# Patient Record
Sex: Male | Born: 1960
Health system: Southern US, Community
[De-identification: ages and names within clinical notes are randomized; demographics above are authoritative.]

## PROBLEM LIST (undated history)

## (undated) DIAGNOSIS — E119 Type 2 diabetes mellitus without complications: Secondary | ICD-10-CM

## (undated) DIAGNOSIS — K5090 Crohn's disease, unspecified, without complications: Secondary | ICD-10-CM

## (undated) DIAGNOSIS — N12 Tubulo-interstitial nephritis, not specified as acute or chronic: Secondary | ICD-10-CM

## (undated) DIAGNOSIS — I2699 Other pulmonary embolism without acute cor pulmonale: Secondary | ICD-10-CM

## (undated) HISTORY — DX: Type 2 diabetes mellitus without complications: E11.9

## (undated) HISTORY — PX: URETERAL STENT PLACEMENT: SHX822

---

## 2008-04-11 ENCOUNTER — Ambulatory Visit: Payer: Self-pay | Admitting: Internal Medicine

## 2008-08-05 ENCOUNTER — Ambulatory Visit: Payer: Self-pay | Admitting: Gastroenterology

## 2008-08-13 ENCOUNTER — Ambulatory Visit: Payer: Self-pay | Admitting: Gastroenterology

## 2008-08-19 ENCOUNTER — Ambulatory Visit: Payer: Self-pay | Admitting: Gastroenterology

## 2008-09-18 ENCOUNTER — Ambulatory Visit: Payer: Self-pay | Admitting: Gastroenterology

## 2008-12-18 ENCOUNTER — Ambulatory Visit: Payer: Self-pay | Admitting: Internal Medicine

## 2010-11-20 ENCOUNTER — Ambulatory Visit: Payer: Self-pay | Admitting: Internal Medicine

## 2011-01-22 ENCOUNTER — Ambulatory Visit: Payer: Self-pay | Admitting: Internal Medicine

## 2013-12-25 ENCOUNTER — Ambulatory Visit: Payer: Self-pay | Admitting: Physician Assistant

## 2014-01-01 ENCOUNTER — Ambulatory Visit: Payer: Self-pay | Admitting: Physician Assistant

## 2014-01-29 ENCOUNTER — Emergency Department: Payer: Self-pay | Admitting: Emergency Medicine

## 2014-01-29 LAB — CBC WITH DIFFERENTIAL/PLATELET
Basophil #: 0.1 10*3/uL (ref 0.0–0.1)
Basophil %: 1.3 %
Eosinophil #: 0.2 10*3/uL (ref 0.0–0.7)
Eosinophil %: 2.6 %
HCT: 36 % — ABNORMAL LOW (ref 40.0–52.0)
HGB: 11 g/dL — ABNORMAL LOW (ref 13.0–18.0)
Lymphocyte #: 1.4 10*3/uL (ref 1.0–3.6)
Lymphocyte %: 24.1 %
MCH: 21.6 pg — ABNORMAL LOW (ref 26.0–34.0)
MCHC: 30.6 g/dL — ABNORMAL LOW (ref 32.0–36.0)
MCV: 71 fL — ABNORMAL LOW (ref 80–100)
Monocyte #: 0.6 x10 3/mm (ref 0.2–1.0)
Monocyte %: 9.9 %
Neutrophil #: 3.7 10*3/uL (ref 1.4–6.5)
Neutrophil %: 62.1 %
Platelet: 240 10*3/uL (ref 150–440)
RBC: 5.09 10*6/uL (ref 4.40–5.90)
RDW: 18 % — ABNORMAL HIGH (ref 11.5–14.5)
WBC: 6 10*3/uL (ref 3.8–10.6)

## 2014-01-29 LAB — COMPREHENSIVE METABOLIC PANEL
Albumin: 3 g/dL — ABNORMAL LOW (ref 3.4–5.0)
Alkaline Phosphatase: 82 U/L
Anion Gap: 6 — ABNORMAL LOW (ref 7–16)
BUN: 13 mg/dL (ref 7–18)
Bilirubin,Total: 0.3 mg/dL (ref 0.2–1.0)
Calcium, Total: 8.4 mg/dL — ABNORMAL LOW (ref 8.5–10.1)
Chloride: 107 mmol/L (ref 98–107)
Co2: 25 mmol/L (ref 21–32)
Creatinine: 0.99 mg/dL (ref 0.60–1.30)
EGFR (African American): >60
EGFR (Non-African Amer.): >60
Glucose: 102 mg/dL — ABNORMAL HIGH (ref 65–99)
Osmolality: 276 (ref 275–301)
Potassium: 3.6 mmol/L (ref 3.5–5.1)
SGOT(AST): 17 U/L (ref 15–37)
SGPT (ALT): 24 U/L
Sodium: 138 mmol/L (ref 136–145)
Total Protein: 8.1 g/dL (ref 6.4–8.2)

## 2014-01-29 LAB — APTT: Activated PTT: 27.5 secs (ref 23.6–35.9)

## 2014-01-29 LAB — PROTIME-INR
INR: 1
Prothrombin Time: 12.7 secs (ref 11.5–14.7)

## 2014-01-29 LAB — TROPONIN I: Troponin-I: <0.02 ng/mL

## 2014-03-22 ENCOUNTER — Ambulatory Visit: Payer: Self-pay

## 2014-03-22 LAB — COMPREHENSIVE METABOLIC PANEL
Albumin: 2.4 g/dL — ABNORMAL LOW (ref 3.4–5.0)
Alkaline Phosphatase: 205 U/L — ABNORMAL HIGH
Anion Gap: 9 (ref 7–16)
BUN: 9 mg/dL (ref 7–18)
Bilirubin,Total: 0.6 mg/dL (ref 0.2–1.0)
Calcium, Total: 8.8 mg/dL (ref 8.5–10.1)
Chloride: 95 mmol/L — ABNORMAL LOW (ref 98–107)
Co2: 28 mmol/L (ref 21–32)
Creatinine: 1.28 mg/dL (ref 0.60–1.30)
EGFR (African American): >60
EGFR (Non-African Amer.): >60
Glucose: 106 mg/dL — ABNORMAL HIGH (ref 65–99)
Osmolality: 264 (ref 275–301)
Potassium: 3.4 mmol/L — ABNORMAL LOW (ref 3.5–5.1)
SGOT(AST): 36 U/L (ref 15–37)
SGPT (ALT): 48 U/L
Sodium: 132 mmol/L — ABNORMAL LOW (ref 136–145)
Total Protein: 9 g/dL — ABNORMAL HIGH (ref 6.4–8.2)

## 2014-03-22 LAB — CBC WITH DIFFERENTIAL/PLATELET
Basophil #: 0.1 10*3/uL (ref 0.0–0.1)
Basophil %: 0.5 %
Eosinophil #: 0 10*3/uL (ref 0.0–0.7)
Eosinophil %: 0.1 %
HCT: 34.3 % — ABNORMAL LOW (ref 40.0–52.0)
HGB: 10.7 g/dL — ABNORMAL LOW (ref 13.0–18.0)
Lymphocyte #: 1.3 10*3/uL (ref 1.0–3.6)
Lymphocyte %: 11.1 %
MCH: 21.6 pg — ABNORMAL LOW (ref 26.0–34.0)
MCHC: 31.3 g/dL — ABNORMAL LOW (ref 32.0–36.0)
MCV: 69 fL — ABNORMAL LOW (ref 80–100)
Monocyte #: 1.3 x10 3/mm — ABNORMAL HIGH (ref 0.2–1.0)
Monocyte %: 11 %
Neutrophil #: 8.8 10*3/uL — ABNORMAL HIGH (ref 1.4–6.5)
Neutrophil %: 77.3 %
Platelet: 362 10*3/uL (ref 150–440)
RBC: 4.98 10*6/uL (ref 4.40–5.90)
RDW: 17.2 % — ABNORMAL HIGH (ref 11.5–14.5)
WBC: 11.4 10*3/uL — ABNORMAL HIGH (ref 3.8–10.6)

## 2014-03-22 LAB — URINALYSIS, COMPLETE
Glucose,UR: NEGATIVE
Ketone: NEGATIVE
Leukocyte Esterase: NEGATIVE
Nitrite: NEGATIVE
Ph: 5.5 (ref 5.0–8.0)
Protein: 30
Specific Gravity: 1.02 (ref 1.000–1.030)
Squamous Epithelial: NONE SEEN

## 2014-03-22 LAB — RAPID INFLUENZA A&B ANTIGENS

## 2014-03-24 LAB — URINE CULTURE

## 2015-08-01 ENCOUNTER — Ambulatory Visit
Admission: EM | Admit: 2015-08-01 | Discharge: 2015-08-01 | Disposition: A | Discharge status: Home / Self Care | Payer: BLUE CROSS/BLUE SHIELD | Attending: Family Medicine | Admitting: Family Medicine

## 2015-08-01 ENCOUNTER — Encounter: Payer: Self-pay | Admitting: *Deleted

## 2015-08-01 DIAGNOSIS — M25552 Pain in left hip: Secondary | ICD-10-CM

## 2015-08-01 DIAGNOSIS — M25562 Pain in left knee: Secondary | ICD-10-CM

## 2015-08-01 DIAGNOSIS — B349 Viral infection, unspecified: Secondary | ICD-10-CM | POA: Diagnosis not present

## 2015-08-01 HISTORY — DX: Tubulo-interstitial nephritis, not specified as acute or chronic: N12

## 2015-08-01 HISTORY — DX: Crohn's disease, unspecified, without complications: K50.90

## 2015-08-01 LAB — RAPID INFLUENZA A&B ANTIGENS
Influenza A (ARMC): NEGATIVE
Influenza B (ARMC): NEGATIVE

## 2015-08-01 LAB — RAPID STREP SCREEN (MED CTR MEBANE ONLY): Streptococcus, Group A Screen (Direct): NEGATIVE

## 2015-08-01 MED ORDER — BENZONATATE 100 MG PO CAPS: 100.0000 mg | ORAL_CAPSULE | Freq: Three times a day (TID) | ORAL | Status: DC | PRN

## 2015-08-01 MED ORDER — MELOXICAM 15 MG PO TABS: 15.0000 mg | ORAL_TABLET | Freq: Every day | ORAL | Status: DC | PRN

## 2015-08-01 NOTE — ED Notes (Signed)
Fever, chills,, body aches x1 week.

## 2015-08-01 NOTE — ED Provider Notes (Signed)
[facility]  ____________________________________________  Time seen: Approximately 3:05 PM  I have reviewed the triage vital signs and the nursing notes.   HISTORY  Chief Complaint Fever; Chills; and Generalized Body Aches  HPI Caleb Banks is a 55 y.o. male presents for the complaints of 2-3 days of runny nose, sore throat, occasional cough, occasional chills and bodyaches. Denies known fevers. States overall felt better today than he did yesterday. Reports continues to eat and drink well. Denies known sick contacts. Reports continues to eat and drink well.  Patient also states that "while I am here" complains of intermittent left hip and left knee pain that is been present times several weeks. Patient reports over the last few months he has just started going back to the gym in which she states that he had not been doing much exercise for quite some time. Patient states initially he had some left knee pain after a week or 2 at the gym. Patient states that the left knee and then improved and he then started having some left hip pain. Denies left hip or left knee pain at the current time. States pain is mild and occasional and intermittent. Patient states that once he started having pain in his left hip last week he stopped going to the gym which helped with all of his pain. Denies fall or trauma. Denies rectal injury. Denies any difficulty in bleeding. Denies urinary or bowel retention or incontinence. Denies back pain. Denies history of similar.  Denies chest pain, shortness breath, abdominal pain, dysuria, neck pain, back pain, urinary or bowel retention or incontinence, numbness or tingling sensations, radiation pain.    Past Medical History  Diagnosis Date  . Crohn's disease ([place])   . Pyelonephritis     There are no active problems to display for this patient.   History reviewed. No pertinent past surgical history.  No current outpatient prescriptions on  file.  Allergies Review of patient's allergies indicates no known allergies.  History reviewed. No pertinent family history.  Social History Social History  Substance Use Topics  . Smoking status: Never Smoker   . Smokeless tobacco: None  . Alcohol Use: No    Review of Systems Constitutional: As above. Eyes: No visual changes. ENT: Positive runny nose, scratchy throat, cough and chills. Cardiovascular: Denies chest pain. Respiratory: Denies shortness of breath. Gastrointestinal: No abdominal pain.  No nausea, no vomiting.  No diarrhea.  No constipation. Genitourinary: Negative for dysuria. Musculoskeletal: Negative for back pain. Skin: Negative for rash. Neurological: Negative for headaches, focal weakness or numbness.  10-point ROS otherwise negative.  ____________________________________________   PHYSICAL EXAM:  VITAL SIGNS: ED Triage Vitals  Enc Vitals Group     BP 08/01/15 1353 125/78 mmHg     Pulse Rate 08/01/15 1353 81     Resp 08/01/15 1353 16     Temp 08/01/15 1353 98 F (36.7 C)     Temp Source 08/01/15 1353 Oral     SpO2 08/01/15 1353 98 %     Weight 08/01/15 1353 340 lb (154.223 kg)     Height 08/01/15 1353 6' 2"  (1.88 m)     Head Cir --      Peak Flow --      Pain Score --      Pain Loc --      Pain Edu? --      Excl. in [place]? --    Constitutional: Alert and oriented. Well appearing and in no acute  distress. Eyes: Conjunctivae are normal. PERRL. EOMI. Head: Atraumatic. No sinus tenderness to palpation. No swelling. No erythema.  Ears: no erythema, normal TMs bilaterally.   Nose:Nasal congestion with clear rhinorrhea  Mouth/Throat: Mucous membranes are moist. Mild pharyngeal erythema. No tonsillar swelling or exudate.  Neck: No stridor.  No cervical spine tenderness to palpation. Hematological/Lymphatic/Immunilogical: No cervical lymphadenopathy. Cardiovascular: Normal rate, regular rhythm. Grossly normal heart sounds.  Good peripheral  circulation. Respiratory: Normal respiratory effort.  No retractions. Lungs CTAB.No wheezes, rales or rhonchi. Good air movement.  Gastrointestinal: Soft and nontender. Obese abdomen. Normal Bowel sounds. No CVA tenderness. Musculoskeletal: No lower or upper extremity tenderness nor edema. No cervical, thoracic or lumbar tenderness to palpation.5/5 strength to bilateral upper and lower extremities. No joint effusions. Left knee stable, no pain with anterior or posterior drawer test, no pain with medial or lateral stress, full range of motion, no erythema, no ecchymosis, skin intact. Left hip nontender to palpation, no pelvic tenderness, no bony tenderness, no erythema, no ecchymosis, no pain with left hip flexion or extension or abduction. Changes positions quickly in room with a steady gait. Ambulatory in room and in hallway. Sensation intact to bilateral upper and lower extremities. No saddle anesthesia. Bilateral plantar and dorsiflexion strong and equal. 2+ bilateral patella reflexes. Neurologic:  Normal speech and language. No gross focal neurologic deficits are appreciated. No gait instability. Skin:  Skin is warm, dry and intact. No rash noted. Psychiatric: Mood and affect are normal. Speech and behavior are normal.  ____________________________________________   LABS (all labs ordered are listed, but only abnormal results are displayed)  Labs Reviewed  RAPID INFLUENZA A&B ANTIGENS ([facility] ONLY)  RAPID STREP SCREEN (NOT AT [facility])  CULTURE, GROUP A STREP [facility])    INITIAL IMPRESSION / ASSESSMENT AND PLAN / ED COURSE  Pertinent labs & imaging results that were available during my care of the patient were reviewed by me and considered in my medical decision making (see chart for details).  Very well-appearing patient. No acute distress. Presents with complaints of multiple medical complaints. Patient stating to 3 days of chills, body aches, sore throat, congestion and cough. Also reports of  intermittent left hip and left knee pain x weeks, denies current pain, after recent increase in physical activity at the gym. Denies known fever. Denies fall or trauma. No focal neurological deficits. Steady gait. No point tenderness, no bony tenderness and pain nonreproducible. Left hip and left knee nontender on exam. Lungs clear throughout. Abdomen soft nontender. Suspect viral illness. Also suspect repetitive use and strain injury to left hip and left knee.  Quick strep negative, will culture. Flu negative. Encouraged rest, fluids, over-the-counter Tylenol as needed. Will treat with when necessary Tessalon Perles. Will also treat with Mobic daily. Discussed evaluating by x-rays, patient states he does not want any x-rays done at this time. Patient states that he will follow-up with his primary care physician as needed for imaging if pain continues. Encourage stretching, alternation of heat and ice and easily follow-up.Ortho info also given.  Discussed follow up with Primary care physician this week. Discussed follow up and return parameters including no resolution or any worsening concerns. Patient verbalized understanding and agreed to plan.   ____________________________________________   FINAL CLINICAL IMPRESSION(S) / ED DIAGNOSES  Final diagnoses:  Viral illness  Left hip pain  Left knee pain      Note: This dictation was prepared with Dragon dictation along with smaller phrase technology. Any transcriptional errors that result from this  process are unintentional.    Marylene Land, NP 08/01/15 1629

## 2015-08-01 NOTE — Discharge Instructions (Signed)
Take medication as prescribed. Rest. Drink plenty of fluids. Take tylenol as needed for fever.   Follow up with your primary care physician this week.Follow up with orthopedic as needed.    Return to Urgent care or ER for new or worsening concerns.    Hip Pain Your hip is the joint between your upper legs and your lower pelvis. The bones, cartilage, tendons, and muscles of your hip joint perform a lot of work each day supporting your body weight and allowing you to move around. Hip pain can range from a minor ache to severe pain in one or both of your hips. Pain may be felt on the inside of the hip joint near the groin, or the outside near the buttocks and upper thigh. You may have swelling or stiffness as well.  HOME CARE INSTRUCTIONS   Take medicines only as directed by your health care provider.  Apply ice to the injured area:  Put ice in a plastic bag.  Place a towel between your skin and the bag.  Leave the ice on for 15-20 minutes at a time, 3-4 times a day.  Keep your leg raised (elevated) when possible to lessen swelling.  Avoid activities that cause pain.  Follow specific exercises as directed by your health care provider.  Sleep with a pillow between your legs on your most comfortable side.  Record how often you have hip pain, the location of the pain, and what it feels like. SEEK MEDICAL CARE IF:   You are unable to put weight on your leg.  Your hip is red or swollen or very tender to touch.  Your pain or swelling continues or worsens after 1 week.  You have increasing difficulty walking.  You have a fever. SEEK IMMEDIATE MEDICAL CARE IF:   You have fallen.  You have a sudden increase in pain and swelling in your hip. MAKE SURE YOU:   Understand these instructions.  Will watch your condition.  Will get help right away if you are not doing well or get worse.   This information is not intended to replace advice given to you by your health care provider.  Make sure you discuss any questions you have with your health care provider.   Document Released: 09/30/2009 Document Revised: 05/03/2014 Document Reviewed: 12/07/2012 Elsevier Interactive Patient Education 2016 Elsevier Inc[employer]  Knee Pain Knee pain is a common problem. It can have many causes. The pain often goes away by following your doctor's home care instructions. Treatment for ongoing pain will depend on the cause of your pain. If your knee pain continues, more tests may be needed to diagnose your condition. Tests may include X-rays or other imaging studies of your knee. HOME CARE  Take medicines only as told by your doctor.  Rest your knee and keep it raised (elevated) while you are resting.  Do not do things that cause pain or make your pain worse.  Avoid activities where both feet leave the ground at the same time, such as running, jumping rope, or doing jumping jacks.  Apply ice to the knee area:  Put ice in a plastic bag.  Place a towel between your skin and the bag.  Leave the ice on for 20 minutes, 2-3 times a day.  Ask your doctor if you should wear an elastic knee support.  Sleep with a pillow under your knee.  Lose weight if you are overweight. Being overweight can make your knee hurt more.  Do not use  any tobacco products, including cigarettes, chewing tobacco, or electronic cigarettes. If you need help quitting, ask your doctor. Smoking may slow the healing of any bone and joint problems that you may have. GET HELP IF:  Your knee pain does not stop, it changes, or it gets worse.  You have a fever along with knee pain.  Your knee gives out or locks up.  Your knee becomes more swollen. GET HELP RIGHT AWAY IF:   Your knee feels hot to the touch.  You have chest pain or trouble breathing.   This information is not intended to replace advice given to you by your health care provider. Make sure you discuss any questions you have with your health care  provider.   Document Released: 07/09/2008 Document Revised: 05/03/2014 Document Reviewed: 06/13/2013 Elsevier Interactive Patient Education 2016 Elsevier Inc.  Viral Infections A viral infection can be caused by different types of viruses.Most viral infections are not serious and resolve on their own. However, some infections may cause severe symptoms and may lead to further complications. SYMPTOMS Viruses can frequently cause:  Minor sore throat.  Aches and pains.  Headaches.  Runny nose.  Different types of rashes.  Watery eyes.  Tiredness.  Cough.  Loss of appetite.  Gastrointestinal infections, resulting in nausea, vomiting, and diarrhea. These symptoms do not respond to antibiotics because the infection is not caused by bacteria. However, you might catch a bacterial infection following the viral infection. This is sometimes called a "superinfection." Symptoms of such a bacterial infection may include:  Worsening sore throat with pus and difficulty swallowing.  Swollen neck glands.  Chills and a high or persistent fever.  Severe headache.  Tenderness over the sinuses.  Persistent overall ill feeling (malaise), muscle aches, and tiredness (fatigue).  Persistent cough.  Yellow, green, or brown mucus production with coughing. HOME CARE INSTRUCTIONS   Only take over-the-counter or prescription medicines for pain, discomfort, diarrhea, or fever as directed by your caregiver.  Drink enough water and fluids to keep your urine clear or pale yellow. Sports drinks can provide valuable electrolytes, sugars, and hydration.  Get plenty of rest and maintain proper nutrition. Soups and broths with crackers or rice are fine. SEEK IMMEDIATE MEDICAL CARE IF:   You have severe headaches, shortness of breath, chest pain, neck pain, or an unusual rash.  You have uncontrolled vomiting, diarrhea, or you are unable to keep down fluids.  You or your child has an oral  temperature above 102 F (38.9 C), not controlled by medicine.  Your baby is older than 3 months with a rectal temperature of 102 F (38.9 C) or higher.  Your baby is 59 months old or younger with a rectal temperature of 100.4 F (38 C) or higher. MAKE SURE YOU:   Understand these instructions.  Will watch your condition.  Will get help right away if you are not doing well or get worse.   This information is not intended to replace advice given to you by your health care provider. Make sure you discuss any questions you have with your health care provider.   Document Released: 01/20/2005 Document Revised: 07/05/2011 Document Reviewed: 09/18/2014 Elsevier Interactive Patient Education [employer].

## 2015-08-03 LAB — CULTURE, GROUP A STREP (THRC)

## 2017-01-24 ENCOUNTER — Encounter: Payer: Self-pay | Admitting: *Deleted

## 2017-01-24 ENCOUNTER — Ambulatory Visit
Admission: EM | Admit: 2017-01-24 | Discharge: 2017-01-24 | Disposition: A | Discharge status: Home / Self Care | Payer: BLUE CROSS/BLUE SHIELD | Attending: Family Medicine | Admitting: Family Medicine

## 2017-01-24 ENCOUNTER — Other Ambulatory Visit: Payer: Self-pay

## 2017-01-24 DIAGNOSIS — R300 Dysuria: Secondary | ICD-10-CM

## 2017-01-24 DIAGNOSIS — K5090 Crohn's disease, unspecified, without complications: Secondary | ICD-10-CM | POA: Insufficient documentation

## 2017-01-24 DIAGNOSIS — M25552 Pain in left hip: Secondary | ICD-10-CM | POA: Diagnosis not present

## 2017-01-24 DIAGNOSIS — M25551 Pain in right hip: Secondary | ICD-10-CM | POA: Insufficient documentation

## 2017-01-24 DIAGNOSIS — Z79899 Other long term (current) drug therapy: Secondary | ICD-10-CM | POA: Insufficient documentation

## 2017-01-24 DIAGNOSIS — D649 Anemia, unspecified: Secondary | ICD-10-CM | POA: Diagnosis not present

## 2017-01-24 DIAGNOSIS — M545 Low back pain: Secondary | ICD-10-CM | POA: Insufficient documentation

## 2017-01-24 DIAGNOSIS — R531 Weakness: Secondary | ICD-10-CM | POA: Insufficient documentation

## 2017-01-24 DIAGNOSIS — N410 Acute prostatitis: Secondary | ICD-10-CM | POA: Diagnosis not present

## 2017-01-24 DIAGNOSIS — R42 Dizziness and giddiness: Secondary | ICD-10-CM | POA: Diagnosis not present

## 2017-01-24 LAB — CBC WITH DIFFERENTIAL/PLATELET
Basophils Absolute: 0.1 10*3/uL (ref 0–0.1)
Basophils Relative: 2 %
Eosinophils Absolute: 0.2 10*3/uL (ref 0–0.7)
Eosinophils Relative: 2 %
HCT: 35.8 % — ABNORMAL LOW (ref 40.0–52.0)
Hemoglobin: 11.7 g/dL — ABNORMAL LOW (ref 13.0–18.0)
Lymphocytes Relative: 23 %
Lymphs Abs: 1.7 10*3/uL (ref 1.0–3.6)
MCH: 21.9 pg — ABNORMAL LOW (ref 26.0–34.0)
MCHC: 32.6 g/dL (ref 32.0–36.0)
MCV: 67.1 fL — ABNORMAL LOW (ref 80.0–100.0)
Monocytes Absolute: 0.6 10*3/uL (ref 0.2–1.0)
Monocytes Relative: 9 %
Neutro Abs: 4.8 10*3/uL (ref 1.4–6.5)
Neutrophils Relative %: 64 %
Platelets: 269 10*3/uL (ref 150–440)
RBC: 5.34 MIL/uL (ref 4.40–5.90)
RDW: 18.4 % — ABNORMAL HIGH (ref 11.5–14.5)
WBC: 7.3 10*3/uL (ref 3.8–10.6)

## 2017-01-24 LAB — BASIC METABOLIC PANEL
Anion gap: 8 (ref 5–15)
BUN: 16 mg/dL (ref 6–20)
CO2: 25 mmol/L (ref 22–32)
Calcium: 8.7 mg/dL — ABNORMAL LOW (ref 8.9–10.3)
Chloride: 103 mmol/L (ref 101–111)
Creatinine, Ser: 1.09 mg/dL (ref 0.61–1.24)
GFR calc Af Amer: >60 mL/min (ref >60)
GFR calc non Af Amer: >60 mL/min (ref >60)
Glucose, Bld: 122 mg/dL — ABNORMAL HIGH (ref 65–99)
Potassium: 3.6 mmol/L (ref 3.5–5.1)
Sodium: 136 mmol/L (ref 135–145)

## 2017-01-24 LAB — URINALYSIS, COMPLETE (UACMP) WITH MICROSCOPIC
Bilirubin Urine: NEGATIVE
Glucose, UA: NEGATIVE mg/dL
Hgb urine dipstick: NEGATIVE
Ketones, ur: NEGATIVE mg/dL
Leukocytes, UA: NEGATIVE
Nitrite: NEGATIVE
Protein, ur: NEGATIVE mg/dL
Specific Gravity, Urine: 1.025 (ref 1.005–1.030)
pH: 5 (ref 5.0–8.0)

## 2017-01-24 MED ORDER — SULFAMETHOXAZOLE-TRIMETHOPRIM 800-160 MG PO TABS: 1.0000 | ORAL_TABLET | Freq: Two times a day (BID) | ORAL | 0 refills | Status: AC

## 2017-01-24 NOTE — ED Provider Notes (Addendum)
[facility] ____________________________________________  Time seen: Approximately 3:16 PM  I have reviewed the triage vital signs and the nursing notes.   HISTORY  Chief Complaint Back Pain; Weakness; and Dizziness   HPI Caleb Banks is a 56 y.o. male  presenting for evaluation of multiple complaints. Patient states complaints have been present for 1-2 weeks intermittently. States last week he noticed he had him suprapubic pressure and some low back pain midline that was described as mild as well as decreased urinary flow. States decreased urinary flow continues intermittently, but suprapubic pressure and back pain has resolved. States he also has bilateral hip pain, but states he has had bilateral hip pain for years without much change, no recent fall, injury or trauma. Denies any pain radiation. Denies abdominal pain or flank pain. States also the last few days he has intermittently had some warm and cool sensation, denies known fevers. Denies any penile or testicular pain, swelling, rash or tenderness. Denies any bulge or mass. Denies penile discharge. States sexually active, declines concerns of STDs. Reports he has had pyelonephritis once before, not with similar presentation. Denies history of kidney stones. Reports has continued to eat and drink well with normal appetite. No over-the-counter medications taken prior to arrival. Denies any fall or trauma. Denies strenuous physical job. Patient states that overall he felt okay, but states he just wanted to make sure he was fine. Patient states overall he feels weak and run down.  Denies chest pain, shortness of breath, abdominal pain, paresthesias, unilateral weakness, other extremity pain, extremity swelling or rash. Denies hematuria, melena, hematochezia or abnormal colored stool. Denies urinary or bowel retention or incontinence. Denies recent sickness. Denies recent antibiotic use.   States does not have primary care  physician.   Past Medical History:  Diagnosis Date  . Crohn's disease ([place])   . Pyelonephritis     There are no active problems to display for this patient.   History reviewed. No pertinent surgical history.   No current facility-administered medications for this encounter.   Current Outpatient Prescriptions:  .  benzonatate (TESSALON PERLES) 100 MG capsule, Take 1 capsule (100 mg total) by mouth 3 (three) times daily as needed for cough., Disp: 15 capsule, Rfl: 0 .  meloxicam (MOBIC) 15 MG tablet, Take 1 tablet (15 mg total) by mouth daily as needed for pain., Disp: 10 tablet, Rfl: 0 .  sulfamethoxazole-trimethoprim (BACTRIM DS,SEPTRA DS) 800-160 MG tablet, Take 1 tablet by mouth 2 (two) times daily., Disp: 28 tablet, Rfl: 0  Allergies Patient has no known allergies.   pertinent family history Denies family history of kidney stones.   Social History Social History  Substance Use Topics  . Smoking status: Never Smoker  . Smokeless tobacco: Never Used  . Alcohol use No    Review of Systems Constitutional: No fever/chills Eyes: No visual changes. ENT: No sore throat. Cardiovascular: Denies chest pain. Respiratory: Denies shortness of breath. Gastrointestinal: No abdominal pain.  No nausea, no vomiting.  No diarrhea.  No constipation.States history of Crohn's disease, no changes in his normal bowel habits. Genitourinary: As above. States intimately feels like he's not fully emptying his bladder. Musculoskeletal: Negative for back pain. Skin: Negative for rash. Neurological: Negative for headaches, focal weakness or numbness.   ____________________________________________   PHYSICAL EXAM:  VITAL SIGNS: ED Triage Vitals  Enc Vitals Group     BP 01/24/17 1456 122/61     Pulse Rate 01/24/17 1456 91     Resp 01/24/17  1456 16     Temp 01/24/17 1456 98.5 F (36.9 C)     Temp Source 01/24/17 1456 Oral     SpO2 01/24/17 1456 96 %     Weight 01/24/17 1458 (!) 340 lb  (154.2 kg)     Height 01/24/17 1458 6' 1"  (1.854 m)     Head Circumference --      Peak Flow --      Pain Score 01/24/17 1500 6     Pain Loc --      Pain Edu? --      Excl. in [place]? --     Constitutional: Alert and oriented. Well appearing and in no acute distress. Eyes: Conjunctivae are normal. PERRL. EOMI. ENT      Head: Normocephalic and atraumatic.      Nose: No congestion/rhinnorhea.      Mouth/Throat: Mucous membranes are moist.Oropharynx non-erythematous. Neck: No stridor. Supple without meningismus.  Hematological/Lymphatic/Immunilogical: No cervical lymphadenopathy. Cardiovascular: Normal rate, regular rhythm. Grossly normal heart sounds.  Good peripheral circulation. Respiratory: Normal respiratory effort without tachypnea nor retractions. Breath sounds are clear and equal bilaterally. No wheezes, rales, rhonchi. Gastrointestinal: Soft and nontender. Obese abdomen. No CVA tenderness.  Prostate: Exam completed with Annie Main RN at bedside a chaperone. Mild prostate tenderness, mild prostate enlargement and firmness. Musculoskeletal:  Nontender with normal range of motion in all extremities. No midline cervical, thoracic or lumbar tenderness to palpation. Bilateral pedal pulses equal and easily palpated. Bilateral inferior to greater trochanter minimal tenderness to palpation, no ecchymosis, no pain with abduction or abduction, full range of motion present, inventory with steady gait, able to weightbearing on each leg. Full range of motion lumbar spine, no lumbar tenderness to palpation. Neurologic:  Normal speech and language. No gross focal neurologic deficits are appreciated. Speech is normal. No gait instability.  Skin:  Skin is warm, dry and intact. No rash noted. Psychiatric: Mood and affect are normal. Speech and behavior are normal. Patient exhibits appropriate insight and judgment   ___________________________________________   LABS (all labs ordered are listed, but only  abnormal results are displayed)  Labs Reviewed  URINALYSIS, COMPLETE (UACMP) WITH MICROSCOPIC - Abnormal; Notable for the following:       Result Value   Squamous Epithelial / LPF 0-5 (*)    Bacteria, UA RARE (*)    All other components within normal limits  CBC WITH DIFFERENTIAL/PLATELET - Abnormal; Notable for the following:    Hemoglobin 11.7 (*)    HCT 35.8 (*)    MCV 67.1 (*)    MCH 21.9 (*)    RDW 18.4 (*)    All other components within normal limits  BASIC METABOLIC PANEL - Abnormal; Notable for the following:    Glucose, Bld 122 (*)    Calcium 8.7 (*)    All other components within normal limits  URINE CULTURE   ____________________________________________  EKG ED ECG REPORT I, Marylene Land, the attending provider, personally viewed and interpreted this ECG.   Date: 01/24/2017  EKG Time:1549  Rate: 93  Rhythm:  normal sinus rhythm  Axis: normal  Intervals:none  ST&T Change: no ST or T wave elevation noted.  No ecg for comparison found.   RADIOLOGY  No results found. ____________________________________________   PROCEDURES Procedures   __________________________________________   INITIAL IMPRESSION / ASSESSMENT AND PLAN / ED COURSE  Pertinent labs & imaging results that were available during my care of the patient were reviewed by me and considered in my medical decision  making (see chart for details).  Well-appearing patient. No acute distress. Labs and urine reviewed. Patient does report has been told he has anemia in the past, not currently taking iron, encourage over-the-counter vitamin with iron supplement. Denies any recent bleeding or abnormal bruising. Patient at this time states he feels overall well. Suspect prostatitis. Patient declines any concerns of STDs. Patient declines imaging of hips at this time and states will follow-up. Will treat patient with oral Bactrim 14 days. Will culture urine. Encouraged establishment follow-up with PCP.  Also information given for follow-up with urology. Strict follow up and return parameters given.Discussed indication, risks and benefits of medications with patient.  Discussed follow up with Primary care physician this week. Discussed follow up and return parameters including no resolution or any worsening concerns. Patient verbalized understanding and agreed to plan.   ____________________________________________   FINAL CLINICAL IMPRESSION(S) / ED DIAGNOSES  Final diagnoses:  Acute prostatitis  Dysuria  Weakness  Anemia, unspecified type     Discharge Medication List as of 01/24/2017  4:58 PM    START taking these medications   Details  sulfamethoxazole-trimethoprim (BACTRIM DS,SEPTRA DS) 800-160 MG tablet Take 1 tablet by mouth 2 (two) times daily., Starting Mon 01/24/2017, Until Mon 02/07/2017, Normal        Note: This dictation was prepared with Dragon dictation along with smaller phrase technology. Any transcriptional errors that result from this process are unintentional.         Marylene Land, NP 01/24/17 1811    Marylene Land, NP 01/24/17 2144

## 2017-01-24 NOTE — ED Triage Notes (Signed)
Flank pain, suprapubic pain, relieved with urination x1 week. Also, bilat hip pain and gen weakness and dizziness.

## 2017-01-24 NOTE — Discharge Instructions (Signed)
Take medication as prescribed. Rest. Drink plenty of fluids. Take vitamin with iron.   Establish primary care and have close follow up. Follow up with urology as discussed, as needed. Return to Urgent care for new or worsening concerns.

## 2017-01-26 ENCOUNTER — Encounter: Payer: Self-pay | Admitting: Emergency Medicine

## 2017-01-26 ENCOUNTER — Ambulatory Visit
Admission: EM | Admit: 2017-01-26 | Discharge: 2017-01-26 | Disposition: A | Discharge status: Home / Self Care | Payer: BLUE CROSS/BLUE SHIELD | Attending: Family Medicine | Admitting: Family Medicine

## 2017-01-26 DIAGNOSIS — D649 Anemia, unspecified: Secondary | ICD-10-CM

## 2017-01-26 DIAGNOSIS — R42 Dizziness and giddiness: Secondary | ICD-10-CM | POA: Diagnosis not present

## 2017-01-26 LAB — BASIC METABOLIC PANEL
Anion gap: 8 (ref 5–15)
BUN: 12 mg/dL (ref 6–20)
CO2: 23 mmol/L (ref 22–32)
Calcium: 8.6 mg/dL — ABNORMAL LOW (ref 8.9–10.3)
Chloride: 103 mmol/L (ref 101–111)
Creatinine, Ser: 1.05 mg/dL (ref 0.61–1.24)
GFR calc Af Amer: >60 mL/min (ref >60)
GFR calc non Af Amer: >60 mL/min (ref >60)
Glucose, Bld: 115 mg/dL — ABNORMAL HIGH (ref 65–99)
Potassium: 3.7 mmol/L (ref 3.5–5.1)
Sodium: 134 mmol/L — ABNORMAL LOW (ref 135–145)

## 2017-01-26 LAB — CBC WITH DIFFERENTIAL/PLATELET
Basophils Absolute: 0.1 10*3/uL (ref 0–0.1)
Basophils Relative: 2 %
Eosinophils Absolute: 0.2 10*3/uL (ref 0–0.7)
Eosinophils Relative: 2 %
HCT: 36.5 % — ABNORMAL LOW (ref 40.0–52.0)
Hemoglobin: 11.7 g/dL — ABNORMAL LOW (ref 13.0–18.0)
Lymphocytes Relative: 19 %
Lymphs Abs: 1.4 10*3/uL (ref 1.0–3.6)
MCH: 21.5 pg — ABNORMAL LOW (ref 26.0–34.0)
MCHC: 31.9 g/dL — ABNORMAL LOW (ref 32.0–36.0)
MCV: 67.5 fL — ABNORMAL LOW (ref 80.0–100.0)
Monocytes Absolute: 0.6 10*3/uL (ref 0.2–1.0)
Monocytes Relative: 9 %
Neutro Abs: 5 10*3/uL (ref 1.4–6.5)
Neutrophils Relative %: 68 %
Platelets: 271 10*3/uL (ref 150–440)
RBC: 5.41 MIL/uL (ref 4.40–5.90)
RDW: 19 % — ABNORMAL HIGH (ref 11.5–14.5)
WBC: 7.3 10*3/uL (ref 3.8–10.6)

## 2017-01-26 LAB — URINE CULTURE: Culture: NO GROWTH

## 2017-01-26 LAB — TSH: TSH: 2.446 u[IU]/mL (ref 0.350–4.500)

## 2017-01-26 NOTE — Discharge Instructions (Signed)
Start iron supplements Add vitamin B12/B-complex vitamin

## 2017-01-26 NOTE — ED Triage Notes (Signed)
Patient states that he was seen for dizziness and other problems on Monday at [facility].  Patient states that he has had on going dizziness that has not improved.  Patient reports some soreness on the right side of his head and behind his right eye that started yesterday.

## 2017-01-26 NOTE — ED Provider Notes (Signed)
[facility]    CSN: [id number] Arrival date & time: 01/26/17  1241     History   Chief Complaint Chief Complaint  Patient presents with  . Dizziness    HPI Phillips Goulette is a 56 y.o. male.   56 yo male seen 2 days ago with with weakness and diagnosed with anemia and prostatitis, presents with complaint of lightheadedness. Denies any numbness/tingling, one-sided weakness, fevers, chills, palpitation, chest pains or shortness of breath.    The history is provided by the patient.    Past Medical History:  Diagnosis Date  . Crohn's disease ([place])   . Pyelonephritis     There are no active problems to display for this patient.   History reviewed. No pertinent surgical history.     Home Medications    Prior to Admission medications   Medication Sig Start Date End Date Taking? Authorizing Provider  sulfamethoxazole-trimethoprim (BACTRIM DS,SEPTRA DS) 800-160 MG tablet Take 1 tablet by mouth 2 (two) times daily. 01/24/17 02/07/17  Marylene Land, NP    Family History Family History  Problem Relation Age of Onset  . Pulmonary embolism Father     Social History Social History  Substance Use Topics  . Smoking status: Never Smoker  . Smokeless tobacco: Never Used  . Alcohol use No     Allergies   Patient has no known allergies.   Review of Systems Review of Systems   Physical Exam Triage Vital Signs ED Triage Vitals  Enc Vitals Group     BP 01/26/17 1253 123/73     Pulse Rate 01/26/17 1253 93     Resp 01/26/17 1253 17     Temp 01/26/17 1253 98.4 F (36.9 C)     Temp Source 01/26/17 1253 Oral     SpO2 01/26/17 1253 97 %     Weight 01/26/17 1249 (!) 344 lb 3.2 oz (156.1 kg)     Height 01/26/17 1249 6' 1"  (1.854 m)     Head Circumference --      Peak Flow --      Pain Score 01/26/17 1250 3     Pain Loc --      Pain Edu? --      Excl. in [place]? --    No data found.   Updated Vital Signs BP 123/73 (BP Location: Right Arm)   Pulse 93    Temp 98.4 F (36.9 C) (Oral)   Resp 17   Ht 6' 1"  (1.854 m)   Wt (!) 344 lb 3.2 oz (156.1 kg)   SpO2 97%   BMI 45.41 kg/m   Visual Acuity Right Eye Distance:   Left Eye Distance:   Bilateral Distance:    Right Eye Near:   Left Eye Near:    Bilateral Near:     Physical Exam  Constitutional: He is oriented to person, place, and time. He appears well-developed and well-nourished. No distress.  HENT:  Head: Normocephalic and atraumatic.  Right Ear: Tympanic membrane, external ear and ear canal normal.  Left Ear: Tympanic membrane, external ear and ear canal normal.  Nose: Nose normal.  Mouth/Throat: Uvula is midline, oropharynx is clear and moist and mucous membranes are normal. No oropharyngeal exudate or tonsillar abscesses.  Eyes: Pupils are equal, round, and reactive to light. Conjunctivae and EOM are normal. Right eye exhibits no discharge. Left eye exhibits no discharge. No scleral icterus.  Neck: Normal range of motion. Neck supple. No tracheal deviation present. No thyromegaly present.  Cardiovascular: Normal rate, regular rhythm and normal heart sounds.   Pulmonary/Chest: Effort normal and breath sounds normal. No stridor. No respiratory distress. He has no wheezes. He has no rales. He exhibits no tenderness.  Lymphadenopathy:    He has no cervical adenopathy.  Neurological: He is alert and oriented to person, place, and time. He displays normal reflexes. No cranial nerve deficit or sensory deficit. He exhibits normal muscle tone. Coordination normal.  Skin: Skin is warm and dry. No rash noted. He is not diaphoretic.  Nursing note and vitals reviewed.    UC Treatments / Results  Labs (all labs ordered are listed, but only abnormal results are displayed) Labs Reviewed  CBC WITH DIFFERENTIAL/PLATELET - Abnormal; Notable for the following:       Result Value   Hemoglobin 11.7 (*)    HCT 36.5 (*)    MCV 67.5 (*)    MCH 21.5 (*)    MCHC 31.9 (*)    RDW 19.0 (*)     All other components within normal limits  BASIC METABOLIC PANEL - Abnormal; Notable for the following:    Sodium 134 (*)    Glucose, Bld 115 (*)    Calcium 8.6 (*)    All other components within normal limits  TSH    EKG  EKG Interpretation None       Radiology No results found.  Procedures Procedures (including critical care time)  Medications Ordered in UC Medications - No data to display   Initial Impression / Assessment and Plan / UC Course  I have reviewed the triage vital signs and the nursing notes.  Pertinent labs & imaging results that were available during my care of the patient were reviewed by me and considered in my medical decision making (see chart for details).         Final Clinical Impressions(s) / UC Diagnoses   Final diagnoses:  Lightheadedness  Anemia, unspecified type    New Prescriptions Discharge Medication List as of 01/26/2017  2:24 PM    1. Lab results and diagnosis reviewed with patient 2. Recommend supportive treatment with otc iron and B vitamins supplements; increase fluids 3. Establish care with PCP 4. Follow-up prn if symptoms worsen or don't improve  Controlled Substance Prescriptions [place] Controlled Substance Registry consulted? Not Applicable   Norval Gable, MD 01/26/17 2033

## 2017-02-14 ENCOUNTER — Ambulatory Visit: Payer: Self-pay | Admitting: Internal Medicine

## 2017-02-16 ENCOUNTER — Ambulatory Visit
Admission: RE | Admit: 2017-02-16 | Discharge: 2017-02-16 | Disposition: A | Discharge status: Home / Self Care | Payer: BLUE CROSS/BLUE SHIELD | Source: Ambulatory Visit | Attending: Internal Medicine | Admitting: Internal Medicine

## 2017-02-16 ENCOUNTER — Encounter: Payer: Self-pay | Admitting: Internal Medicine

## 2017-02-16 ENCOUNTER — Ambulatory Visit (INDEPENDENT_AMBULATORY_CARE_PROVIDER_SITE_OTHER): Payer: BLUE CROSS/BLUE SHIELD | Admitting: Internal Medicine

## 2017-02-16 VITALS — BP 94/54 | HR 72 | Ht 73.5 in | Wt 343.0 lb

## 2017-02-16 DIAGNOSIS — M25551 Pain in right hip: Secondary | ICD-10-CM | POA: Diagnosis present

## 2017-02-16 DIAGNOSIS — R42 Dizziness and giddiness: Secondary | ICD-10-CM | POA: Diagnosis not present

## 2017-02-16 DIAGNOSIS — M25552 Pain in left hip: Secondary | ICD-10-CM

## 2017-02-16 DIAGNOSIS — M47817 Spondylosis without myelopathy or radiculopathy, lumbosacral region: Secondary | ICD-10-CM | POA: Diagnosis not present

## 2017-02-16 DIAGNOSIS — D509 Iron deficiency anemia, unspecified: Secondary | ICD-10-CM

## 2017-02-16 DIAGNOSIS — M47816 Spondylosis without myelopathy or radiculopathy, lumbar region: Secondary | ICD-10-CM | POA: Insufficient documentation

## 2017-02-16 DIAGNOSIS — K5010 Crohn's disease of large intestine without complications: Secondary | ICD-10-CM

## 2017-02-16 DIAGNOSIS — M4316 Spondylolisthesis, lumbar region: Secondary | ICD-10-CM | POA: Diagnosis not present

## 2017-02-16 DIAGNOSIS — M5432 Sciatica, left side: Secondary | ICD-10-CM

## 2017-02-16 DIAGNOSIS — R9389 Abnormal findings on diagnostic imaging of other specified body structures: Secondary | ICD-10-CM | POA: Insufficient documentation

## 2017-02-16 DIAGNOSIS — K50118 Crohn's disease of large intestine with other complication: Secondary | ICD-10-CM | POA: Insufficient documentation

## 2017-02-16 NOTE — Progress Notes (Signed)
Date:  02/16/2017   Name:  Caleb Banks [facility]   DOB:  1961-01-19   MRN:  [id number]   Chief Complaint: Establish Care; Anemia (Was diagnosed with anemia in UC a few weeks ago. Was having dizziness when diagnosed. ); and Hip Pain (Started over a year ago but getting worse. And starting to bother patient more. )  IBD - diagnosed with Crohns disease about 25 years ago. Last colonoscopy 2010 with Dr. Donnella Sham.  On no medications for many years.  He only has intermittent bleeding with scant red blood. His stool is narrowed from known sigmoid narrowing.  Hip pain - he has had bilateral hip discomfort for up to 18 months, gradually worsening.  He has most of the discomfort in the low back and lateral hip with prolonged standing.  Not much discomfort with walking, unless prolonged, or with stair climbing.  He does have occasional left sided tingling in his leg but without weakness.  He has no hx of injury.  Lightheadedness - he was seen in ED on 10/1 for prostatitis and mild lightheadedness.  His Hgb was 11.7.  He received cipro for 7 days.  He was then seen 10/3 for lightheadedness that was attributed to mild anemia.  He has been fairly well since with one slight episode 3 days ago.  No syncope, chest pain, palpitations with this.  He does have mild tinnitus in right ear and some frontal sinus congestion.  Review of Systems  Constitutional: Negative for appetite change, chills, fatigue and fever.  HENT: Positive for congestion and tinnitus. Negative for postnasal drip, sinus pressure, sore throat and trouble swallowing.   Respiratory: Negative for chest tightness and shortness of breath.   Cardiovascular: Negative for chest pain, palpitations and leg swelling.  Gastrointestinal: Positive for blood in stool. Negative for abdominal pain, constipation and rectal pain.  Musculoskeletal: Positive for arthralgias and back pain. Negative for gait problem, joint swelling and myalgias.  Neurological: Positive for  light-headedness. Negative for dizziness, tremors, syncope, weakness, numbness and headaches.  Psychiatric/Behavioral: Negative for sleep disturbance.    There are no active problems to display for this patient.   Prior to Admission medications   Not on File    No Known Allergies  History reviewed. No pertinent surgical history.  Social History  Substance Use Topics  . Smoking status: Never Smoker  . Smokeless tobacco: Never Used  . Alcohol use No     Medication list has been reviewed and updated.  PHQ 2/9 Scores 02/16/2017  PHQ - 2 Score 0    Physical Exam  Constitutional: He is oriented to person, place, and time. He appears well-developed. No distress.  HENT:  Head: Normocephalic and atraumatic.  Right Ear: Tympanic membrane and ear canal normal.  Left Ear: Tympanic membrane and ear canal normal.  Nose: Right sinus exhibits frontal sinus tenderness. Left sinus exhibits frontal sinus tenderness.  Mouth/Throat: Oropharynx is clear and moist. No oropharyngeal exudate, posterior oropharyngeal edema or posterior oropharyngeal erythema.  Eyes: Pupils are equal, round, and reactive to light. Right eye exhibits nystagmus. Left eye exhibits nystagmus.  Neck: Normal range of motion. Neck supple. No thyromegaly present.  Cardiovascular: Normal rate, regular rhythm and normal heart sounds.   Pulmonary/Chest: Effort normal. No respiratory distress. He has no wheezes.  Musculoskeletal: Normal range of motion.       Right hip: He exhibits tenderness (mild lateral hip). He exhibits normal range of motion and normal strength.  Left hip: He exhibits tenderness (mild lateral hip). He exhibits normal range of motion and normal strength.       Lumbar back: Normal.  Neurological: He is alert and oriented to person, place, and time.  Skin: Skin is warm and dry. No rash noted.  Psychiatric: He has a normal mood and affect. His speech is normal and behavior is normal. Thought content  normal.  Nursing note and vitals reviewed.   BP (!) 94/54   Pulse 72   Ht 6' 1.5" (1.867 m)   Wt (!) 343 lb (155.6 kg)   SpO2 96%   BMI 44.64 kg/m   Assessment and Plan: 1. Crohn's disease of colon without complication ([place]) Needs follow up with GI Will recheck labs - CBC with Differential/Platelet - Comprehensive metabolic panel - Sedimentation rate - Ambulatory referral to Gastroenterology  2. Pain of both hip joints Obtain xray and then refer if needed Avoid advil - tylenol is a better choice - DG HIP UNILAT WITH PELVIS 2-3 VIEWS LEFT; Future  3. Microcytic anemia - CBC with Differential/Platelet - Vitamin B12 - Fe+TIBC+Fer  4. Sciatica of left side - DG Lumbar Spine Complete; Future  5. Episodic lightheadedness Likely viral etiology that should be self limited Follow up if no resolution over next week  No orders of the defined types were placed in this encounter.   Partially dictated using Editor, commissioning. Any errors are unintentional.  Halina Maidens, MD [facility]  02/16/2017

## 2017-02-17 ENCOUNTER — Ambulatory Visit: Payer: Self-pay | Admitting: Internal Medicine

## 2017-02-17 LAB — CBC WITH DIFFERENTIAL/PLATELET
Basophils Absolute: 0 10*3/uL (ref 0.0–0.2)
Basos: 1 %
EOS (ABSOLUTE): 0.1 10*3/uL (ref 0.0–0.4)
Eos: 2 %
Hematocrit: 34.7 % — ABNORMAL LOW (ref 37.5–51.0)
Hemoglobin: 11.5 g/dL — ABNORMAL LOW (ref 13.0–17.7)
Immature Grans (Abs): 0 10*3/uL (ref 0.0–0.1)
Immature Granulocytes: 0 %
Lymphocytes Absolute: 1.9 10*3/uL (ref 0.7–3.1)
Lymphs: 29 %
MCH: 21.6 pg — ABNORMAL LOW (ref 26.6–33.0)
MCHC: 33.1 g/dL (ref 31.5–35.7)
MCV: 65 fL — ABNORMAL LOW (ref 79–97)
Monocytes Absolute: 0.6 10*3/uL (ref 0.1–0.9)
Monocytes: 9 %
Neutrophils Absolute: 3.9 10*3/uL (ref 1.4–7.0)
Neutrophils: 59 %
Platelets: 297 10*3/uL (ref 150–379)
RBC: 5.33 x10E6/uL (ref 4.14–5.80)
RDW: 19.6 % — ABNORMAL HIGH (ref 12.3–15.4)
WBC: 6.5 10*3/uL (ref 3.4–10.8)

## 2017-02-17 LAB — IRON,TIBC AND FERRITIN PANEL
Ferritin: 291 ng/mL (ref 30–400)
Iron Saturation: 17 % (ref 15–55)
Iron: 46 ug/dL (ref 38–169)
Total Iron Binding Capacity: 271 ug/dL (ref 250–450)
UIBC: 225 ug/dL (ref 111–343)

## 2017-02-17 LAB — COMPREHENSIVE METABOLIC PANEL
ALT: 17 IU/L (ref 0–44)
AST: 14 IU/L (ref 0–40)
Albumin/Globulin Ratio: 1 — ABNORMAL LOW (ref 1.2–2.2)
Albumin: 4.1 g/dL (ref 3.5–5.5)
Alkaline Phosphatase: 86 IU/L (ref 39–117)
BUN/Creatinine Ratio: 14 (ref 9–20)
BUN: 12 mg/dL (ref 6–24)
Bilirubin Total: 0.3 mg/dL (ref 0.0–1.2)
CO2: 26 mmol/L (ref 20–29)
Calcium: 8.9 mg/dL (ref 8.7–10.2)
Chloride: 101 mmol/L (ref 96–106)
Creatinine, Ser: 0.87 mg/dL (ref 0.76–1.27)
GFR calc Af Amer: 113 mL/min/{1.73_m2} (ref >59)
GFR calc non Af Amer: 98 mL/min/{1.73_m2} (ref >59)
Globulin, Total: 4.1 g/dL (ref 1.5–4.5)
Glucose: 67 mg/dL (ref 65–99)
Potassium: 4.6 mmol/L (ref 3.5–5.2)
Sodium: 141 mmol/L (ref 134–144)
Total Protein: 8.2 g/dL (ref 6.0–8.5)

## 2017-02-17 LAB — SEDIMENTATION RATE: Sed Rate: 69 mm/hr — ABNORMAL HIGH (ref 0–30)

## 2017-02-17 LAB — VITAMIN B12: Vitamin B-12: 596 pg/mL (ref 232–1245)

## 2017-04-05 ENCOUNTER — Ambulatory Visit
Admission: RE | Admit: 2017-04-05 | Payer: BLUE CROSS/BLUE SHIELD | Source: Ambulatory Visit | Admitting: Gastroenterology

## 2017-04-05 ENCOUNTER — Encounter: Admission: RE | Payer: Self-pay | Source: Ambulatory Visit

## 2017-04-05 SURGERY — COLONOSCOPY WITH PROPOFOL
Anesthesia: General

## 2017-04-08 ENCOUNTER — Ambulatory Visit
Admission: RE | Admit: 2017-04-08 | Discharge: 2017-04-08 | Disposition: A | Discharge status: Home Health | Payer: BLUE CROSS/BLUE SHIELD | Source: Ambulatory Visit | Attending: Gastroenterology | Admitting: Gastroenterology

## 2017-04-08 ENCOUNTER — Encounter
Admission: RE | Disposition: A | Discharge status: Home Health | Payer: Self-pay | Source: Ambulatory Visit | Attending: Gastroenterology

## 2017-04-08 ENCOUNTER — Encounter: Payer: Self-pay | Admitting: *Deleted

## 2017-04-08 ENCOUNTER — Ambulatory Visit: Payer: BLUE CROSS/BLUE SHIELD | Admitting: Anesthesiology

## 2017-04-08 DIAGNOSIS — K624 Stenosis of anus and rectum: Secondary | ICD-10-CM | POA: Insufficient documentation

## 2017-04-08 DIAGNOSIS — K529 Noninfective gastroenteritis and colitis, unspecified: Secondary | ICD-10-CM | POA: Insufficient documentation

## 2017-04-08 DIAGNOSIS — K5090 Crohn's disease, unspecified, without complications: Secondary | ICD-10-CM | POA: Diagnosis not present

## 2017-04-08 DIAGNOSIS — K6289 Other specified diseases of anus and rectum: Secondary | ICD-10-CM | POA: Insufficient documentation

## 2017-04-08 DIAGNOSIS — Z882 Allergy status to sulfonamides status: Secondary | ICD-10-CM | POA: Diagnosis not present

## 2017-04-08 HISTORY — PX: COLONOSCOPY WITH PROPOFOL: SHX5780

## 2017-04-08 HISTORY — PX: ESOPHAGOGASTRODUODENOSCOPY (EGD) WITH PROPOFOL: SHX5813

## 2017-04-08 SURGERY — ESOPHAGOGASTRODUODENOSCOPY (EGD) WITH PROPOFOL
Anesthesia: General

## 2017-04-08 MED ORDER — PROPOFOL 500 MG/50ML IV EMUL
INTRAVENOUS | Status: DC | PRN
  Administered 2017-04-08: 75 ug/kg/min via INTRAVENOUS

## 2017-04-08 MED ORDER — SODIUM CHLORIDE 0.9 % IV SOLN: INTRAVENOUS | Status: DC

## 2017-04-08 MED ORDER — GLYCOPYRROLATE 0.2 MG/ML IJ SOLN
INTRAMUSCULAR | Status: AC
  Filled 2017-04-08: qty 1

## 2017-04-08 MED ORDER — MIDAZOLAM HCL 5 MG/5ML IJ SOLN
INTRAMUSCULAR | Status: DC | PRN
  Administered 2017-04-08 (×2): 2 mg via INTRAVENOUS

## 2017-04-08 MED ORDER — FENTANYL CITRATE (PF) 100 MCG/2ML IJ SOLN
INTRAMUSCULAR | Status: DC | PRN
  Administered 2017-04-08 (×2): 50 ug via INTRAVENOUS

## 2017-04-08 MED ORDER — FENTANYL CITRATE (PF) 100 MCG/2ML IJ SOLN
INTRAMUSCULAR | Status: AC
  Filled 2017-04-08: qty 2

## 2017-04-08 MED ORDER — LIDOCAINE HCL (PF) 2 % IJ SOLN
INTRAMUSCULAR | Status: DC | PRN
  Administered 2017-04-08: 100 mg

## 2017-04-08 MED ORDER — PROPOFOL 500 MG/50ML IV EMUL
INTRAVENOUS | Status: AC
  Filled 2017-04-08: qty 50

## 2017-04-08 MED ORDER — LIDOCAINE HCL (PF) 2 % IJ SOLN
INTRAMUSCULAR | Status: AC
  Filled 2017-04-08: qty 10

## 2017-04-08 MED ORDER — MIDAZOLAM HCL 2 MG/2ML IJ SOLN
INTRAMUSCULAR | Status: AC
  Filled 2017-04-08: qty 2

## 2017-04-08 MED ORDER — PROPOFOL 10 MG/ML IV BOLUS
INTRAVENOUS | Status: DC | PRN
  Administered 2017-04-08: 50 mg via INTRAVENOUS

## 2017-04-08 MED ORDER — GLYCOPYRROLATE 0.2 MG/ML IJ SOLN
INTRAMUSCULAR | Status: DC | PRN
  Administered 2017-04-08: 0.2 mg via INTRAVENOUS

## 2017-04-08 MED ORDER — SODIUM CHLORIDE 0.9 % IV SOLN
INTRAVENOUS | Status: DC
  Administered 2017-04-08: 1000 mL via INTRAVENOUS

## 2017-04-08 NOTE — Anesthesia Post-op Follow-up Note (Signed)
Anesthesia QCDR form completed.        

## 2017-04-08 NOTE — Anesthesia Preprocedure Evaluation (Signed)
Anesthesia Evaluation  Patient identified by MRN, date of birth, ID band Patient awake    Reviewed: Allergy & Precautions, NPO status , Patient's Chart, lab work & pertinent test results  History of Anesthesia Complications Negative for: history of anesthetic complications  Airway Mallampati: III  TM Distance: >3 FB Neck ROM: Full    Dental no notable dental hx.    Pulmonary neg pulmonary ROS, neg sleep apnea, neg COPD,    breath sounds clear to auscultation- rhonchi (-) wheezing      Cardiovascular Exercise Tolerance: Good (-) hypertension(-) CAD and (-) Past MI  Rhythm:Regular Rate:Normal - Systolic murmurs and - Diastolic murmurs    Neuro/Psych negative neurological ROS  negative psych ROS   GI/Hepatic negative GI ROS, Neg liver ROS,   Endo/Other  negative endocrine ROSneg diabetes  Renal/GU negative Renal ROS     Musculoskeletal negative musculoskeletal ROS (+)   Abdominal (+) + obese,   Peds  Hematology  (+) anemia ,   Anesthesia Other Findings Past Medical History: No date: Crohn's disease (HCC) No date: Pyelonephritis   Reproductive/Obstetrics                             Anesthesia Physical Anesthesia Plan  ASA: II  Anesthesia Plan: General   Post-op Pain Management:    Induction: Intravenous  PONV Risk Score and Plan: 1 and Propofol infusion  Airway Management Planned: Natural Airway  Additional Equipment:   Intra-op Plan:   Post-operative Plan:   Informed Consent: I have reviewed the patients History and Physical, chart, labs and discussed the procedure including the risks, benefits and alternatives for the proposed anesthesia with the patient or authorized representative who has indicated his/her understanding and acceptance.   Dental advisory given  Plan Discussed with: CRNA and Anesthesiologist  Anesthesia Plan Comments:         Anesthesia Quick  Evaluation

## 2017-04-08 NOTE — Transfer of Care (Signed)
Immediate Anesthesia Transfer of Care Note  Patient: Caleb Banks.  Procedure(s) Performed: ESOPHAGOGASTRODUODENOSCOPY (EGD) WITH PROPOFOL (N/A ) COLONOSCOPY WITH PROPOFOL (N/A )  Patient Location: PACU  Anesthesia Type:General  Level of Consciousness: awake, alert  and oriented  Airway & Oxygen Therapy: Patient Spontanous Breathing and Patient connected to nasal cannula oxygen  Post-op Assessment: Report given to RN and Post -op Vital signs reviewed and stable  Post vital signs: Reviewed and stable  Last Vitals:  Vitals:   04/08/17 1306 04/08/17 1437  BP: 111/65 (!) 100/55  Pulse: 92 87  Resp: 18 (!) 24  Temp: (!) 35.9 C (!) 36 C  SpO2:  92%    Last Pain:  Vitals:   04/08/17 1437  TempSrc: Tympanic      Patients Stated Pain Goal: 0 (09/11/31 5825)  Complications: No apparent anesthesia complications

## 2017-04-08 NOTE — Op Note (Signed)
[facility] Gastroenterology Patient Name: Caleb Banks Procedure Date: 04/08/2017 1:45 PM MRN: [id number] Account #: [id number] Date of Birth: [birth year] Admit Type: Outpatient Age: 56 Room: [facility] ENDO ROOM 3 Gender: Male Note Status: Finalized Procedure:            Upper GI endoscopy Indications:          Crohn's disease Providers:            Lollie Sails, MD Referring MD:         Halina Maidens, MD (Referring MD) Medicines:            Monitored Anesthesia Care Complications:        No immediate complications. Procedure:            Pre-Anesthesia Assessment:                       - ASA Grade Assessment: II - A patient with mild                        systemic disease.                       After obtaining informed consent, the endoscope was                        passed under direct vision. Throughout the procedure,                        the patient's blood pressure, pulse, and oxygen                        saturations were monitored continuously. The procedure                        was aborted. The scope was not inserted. Medications                        were given. The upper GI endoscopy was unusually                        difficult due to patient intolerance of esophageal                        intubation. Findings:      I attempted to introduce the scope multiple times, unsuccessfully, due       to intolerance of intubation. I was unable to pass beyond the level of       the upper esophageal sphincter. Impression:           - No specimens collected. Recommendation:       - Perform a colonoscopy today.                       - Discharge patient to home.                       - Will consider Upper GI/small bowel series via                        radiology for further evaluation. Diagnosis Code(s):    --- Professional ---  K50.90, Crohn's disease, unspecified, without                        complications Lollie Sails,  MD 04/08/2017 2:42:25 PM This report has been signed electronically. Number of Addenda: 0 Note Initiated On: 04/08/2017 1:45 PM Total Procedure Duration: 0 hours 14 minutes 46 seconds       [facility]

## 2017-04-08 NOTE — Op Note (Signed)
[facility] Gastroenterology Patient Name: Caleb Banks Procedure Date: 04/08/2017 1:45 PM MRN: [id number] Account #: [id number] Date of Birth: [birth year] Admit Type: Outpatient Age: 56 Room: [facility] ENDO ROOM 3 Gender: Male Note Status: Finalized Procedure:            Colonoscopy Indications:          Personal history of Crohn's disease Providers:            Lollie Sails, MD Referring MD:         Halina Maidens, MD (Referring MD) Medicines:            Monitored Anesthesia Care Complications:        No immediate complications. Procedure:            Pre-Anesthesia Assessment:                       - ASA Grade Assessment: II - A patient with mild                        systemic disease.                       After obtaining informed consent, the colonoscope was                        passed under direct vision. Throughout the procedure,                        the patient's blood pressure, pulse, and oxygen                        saturations were monitored continuously. The Olympus                        PCF-H180AL colonoscope ( S#: [id number] ) was introduced                        through the anus with the intention of advancing to the                        cecum. The scope was advanced to the descending colon                        before the procedure was aborted. Medications were                        given. The patient tolerated the procedure well. The                        quality of the bowel preparation was fair. The                        colonoscopy was unusually difficult due to stenosis.                        The colonoscopy was unusually difficult due to                        stenosis. Successful completion of the procedure was  aided by withdrawing the scope and replacing with the                        adult endoscope. Findings:      A benign-appearing, intrinsic moderate stenosis measuring 2 cm (in       length) x 7-8 mm  (inner diameter) was found at the anus. This would not       allow passage of the Pediatric colonoscope. This was changed to an adult       endoscope and carefully advanced to about 55 cm from the anal verge.       There is an area of stenosis at 42 cm, biopsies taken, which was passed,       and a stenosis at 55 cm (biopsies taken) which could not be passed by       the endoscope. There was minimal evidence of inflammation throughout       except at the stenosis as noted above this appearing grossly as possible       granular tissue. Random biopsies were taken from 30 cm and from the       rectum. Impression:           - Preparation of the colon was fair.                       - Stricture at the anus. Stricture at 55 cm from the                        anal verge. Recommendation:       - Await pathology results.                       - Advance diet as tolerated.                       - Await biopsies, further evaluation to be recommended                        at tertiary institutioin. Possible consideration of                        possible anoplasty. Procedure Code(s):    --- Professional ---                       [id number], 63, Colonoscopy, flexible; diagnostic, including                        collection of specimen(s) by brushing or washing, when                        performed (separate procedure) Diagnosis Code(s):    --- Professional ---                       K62.4, Stenosis of anus and rectum                       Z87.19, Personal history of other diseases of the                        digestive system CPT copyright 2016 American Medical Association. All rights reserved. The codes documented in this  report are preliminary and upon coder review may  be revised to meet current compliance requirements. Lollie Sails, MD 04/08/2017 2:59:21 PM This report has been signed electronically. Number of Addenda: 0 Note Initiated On: 04/08/2017 1:45 PM Total Procedure Duration: 0 hours  11 minutes 51 seconds       [facility]

## 2017-04-08 NOTE — H&P (Signed)
Outpatient short stay form Pre-procedure 04/08/2017 1:41 PM Lollie Sails MD  Primary Physician:  No primary care physician  Reason for visit:  EGD and colonoscopy  History of present illness:  Patient is a 56 year old male presenting today as above. He has a history of being diagnosed with Crohn's disease in the early 1990s. He apparently underwent a prolonged hospitalization at that time but apparently no surgery. He did undergo a colonoscopy last time in 2010. This showed a narrowing which could not be passed in the proximal descending/splenic flexure region. He has had CT scans indicated no small bowel or more proximal disease. He tolerated his prep well. He takes no aspirin or blood thinning agents.    Current Facility-Administered Medications:  .  0.9 %  sodium chloride infusion, , Intravenous, Continuous, Lollie Sails, MD, Last Rate: 20 mL/hr at 04/08/17 1334, 1,000 mL at 04/08/17 1334 .  0.9 %  sodium chloride infusion, , Intravenous, Continuous, Lollie Sails, MD .  0.9 %  sodium chloride infusion, , Intravenous, Continuous, Lollie Sails, MD  Medications Prior to Admission  Medication Sig Dispense Refill Last Dose  . cyanocobalamin 500 MCG tablet Take 500 mcg by mouth daily.   Past Month at Unknown time  . ferrous sulfate 325 (65 FE) MG tablet Take 325 mg by mouth daily with breakfast.   Past Week at Unknown time  . Multiple Vitamins-Minerals (MULTIVITAMIN WITH MINERALS) tablet Take 1 tablet by mouth daily.   Past Month at Unknown time     Allergies  Allergen Reactions  . Sulfa Antibiotics      Past Medical History:  Diagnosis Date  . Crohn's disease ([place])   . Pyelonephritis     Review of systems:      Physical Exam    Heart and lungs: Regular rate and rhythm without rub or gallop, lungs are bilaterally clear.    HEENT: Normocephalic atraumatic eyes are anicteric    Other:     Pertinant exam for procedure: Soft nontender nondistended bowel  sounds positive normoactive    Planned proceedures: EGD and colonoscopy with indicated procedures I have discussed the risks benefits and complications of procedures to include not limited to bleeding, infection, perforation and the risk of sedation and the patient wishes to proceed.    Lollie Sails, MD Gastroenterology 04/08/2017  1:41 PM

## 2017-04-11 NOTE — Anesthesia Postprocedure Evaluation (Signed)
Anesthesia Post Note  Patient: Caleb Banks.  Procedure(s) Performed: ESOPHAGOGASTRODUODENOSCOPY (EGD) WITH PROPOFOL (N/A ) COLONOSCOPY WITH PROPOFOL (N/A )  Patient location during evaluation: Endoscopy Anesthesia Type: General Level of consciousness: awake and alert and oriented Pain management: pain level controlled Vital Signs Assessment: post-procedure vital signs reviewed and stable Respiratory status: spontaneous breathing, nonlabored ventilation and respiratory function stable Cardiovascular status: blood pressure returned to baseline and stable Postop Assessment: no signs of nausea or vomiting Anesthetic complications: no     Last Vitals:  Vitals:   04/08/17 1447 04/08/17 1507  BP: (!) 82/50 98/76  Pulse: 92 84  Resp: (!) 21 17  Temp:    SpO2: 93% 96%    Last Pain:  Vitals:   04/08/17 1437  TempSrc: Tympanic                 Shandon Matson

## 2017-04-12 ENCOUNTER — Encounter: Payer: Self-pay | Admitting: Gastroenterology

## 2017-04-13 LAB — SURGICAL PATHOLOGY

## 2017-05-10 DIAGNOSIS — K624 Stenosis of anus and rectum: Secondary | ICD-10-CM | POA: Insufficient documentation

## 2018-01-08 ENCOUNTER — Other Ambulatory Visit: Payer: Self-pay

## 2018-01-08 ENCOUNTER — Encounter: Payer: Self-pay | Admitting: Gynecology

## 2018-01-08 ENCOUNTER — Ambulatory Visit
Admission: EM | Admit: 2018-01-08 | Discharge: 2018-01-08 | Disposition: A | Discharge status: Home / Self Care | Payer: Self-pay

## 2018-01-08 ENCOUNTER — Ambulatory Visit
Admission: EM | Admit: 2018-01-08 | Discharge: 2018-01-08 | Disposition: A | Discharge status: Home / Self Care | Payer: BLUE CROSS/BLUE SHIELD | Attending: Family Medicine | Admitting: Family Medicine

## 2018-01-08 DIAGNOSIS — R39198 Other difficulties with micturition: Secondary | ICD-10-CM

## 2018-01-08 DIAGNOSIS — M25561 Pain in right knee: Secondary | ICD-10-CM

## 2018-01-08 DIAGNOSIS — R350 Frequency of micturition: Secondary | ICD-10-CM

## 2018-01-08 LAB — URINALYSIS, COMPLETE (UACMP) WITH MICROSCOPIC
Bacteria, UA: NONE SEEN
Bilirubin Urine: NEGATIVE
Glucose, UA: NEGATIVE mg/dL
Hgb urine dipstick: NEGATIVE
Ketones, ur: NEGATIVE mg/dL
Leukocytes, UA: NEGATIVE
Nitrite: NEGATIVE
Protein, ur: NEGATIVE mg/dL
Specific Gravity, Urine: 1.02 (ref 1.005–1.030)
Squamous Epithelial / LPF: NONE SEEN (ref 0–5)
pH: 5 (ref 5.0–8.0)

## 2018-01-08 MED ORDER — MELOXICAM 7.5 MG PO TABS: ORAL_TABLET | ORAL | 0 refills | Status: DC

## 2018-01-08 NOTE — ED Triage Notes (Signed)
Patient c/o  Right knee pain / swollen x couple months. Patient also c/o frequent urination x 1 month ago.

## 2018-01-08 NOTE — Discharge Instructions (Signed)
KNEE PAIN: Based on your presentation there is likely underlying arthritis of the knee and possible synovial cyst. Begin meloxicam daily. Stressed avoiding painful activities . PRICE guidelines reviewed. Use medications as directed. Take NSAIDs for pain and inflammation. F/u with ortho or PCP for reexamination or our office sooner for any questions or concerns you may have and we will be happy to help you! If this worsens or continues over the next 2 weeks you may require imaging of the knee such as ultrasound or MRI  DIFFICULTY URINATING: Your urinalysis today was normal. You are advised to make a follow up appointment with your PCP for routine blood work and to have a PSA test to assess for enlarged prostate and other prostate issues. We will send your urine for culture and treat you with antibiotics if there is a UTI, but this is unlikely due to the normal Urinalysis. F/u with our office or ER if condition suddenly changes or worsens before appointment with your PCP

## 2018-01-08 NOTE — ED Provider Notes (Signed)
[facility]    CSN: [id number] Arrival date & time: 01/08/18  1413     History   Chief Complaint Chief Complaint  Patient presents with  . Knee Pain  . Urinary Frequency    HPI Caleb Mimes Almendarez Brooke Bonito. is a 57 y.o. male. Patient is an obese AA male who presents for two unrelated complaints. He states that he has been having intermittent right knee pain and swelling for 2 months. He denies injury. He says the pain is mild and improves with Motrin. He has near full ROM of the knee--experiences some discomfort with full flexion. Denies instability, popping, locking, and catching of the knee. He has no history of knee problems.   His next concern is that he has been having intermittent difficulty initiating urinary stream, increased urgency and frequency at times. He denies painful urination. Denies blood in urine. Denies penile discharge and scrotal pain. Patient not concerned about STIs. He says he has had a problem with his prostate in the past, but denies any known problems/diagnoses related to the urinary concern.  HPI  Past Medical History:  Diagnosis Date  . Crohn's disease ([place])   . Pyelonephritis     Patient Active Problem List   Diagnosis Date Noted  . Crohn's colitis ([place]) 02/16/2017  . Pain of both hip joints 02/16/2017  . Microcytic anemia 02/16/2017  . Sciatica of left side 02/16/2017  . Episodic lightheadedness 02/16/2017    Past Surgical History:  Procedure Laterality Date  . COLONOSCOPY WITH PROPOFOL N/A 04/08/2017   Procedure: COLONOSCOPY WITH PROPOFOL;  Surgeon: Lollie Sails, MD;  Location: [facility] ENDOSCOPY;  Service: Endoscopy;  Laterality: N/A;  . ESOPHAGOGASTRODUODENOSCOPY (EGD) WITH PROPOFOL N/A 04/08/2017   Procedure: ESOPHAGOGASTRODUODENOSCOPY (EGD) WITH PROPOFOL;  Surgeon: Lollie Sails, MD;  Location: [facility] ENDOSCOPY;  Service: Endoscopy;  Laterality: N/A;       Home Medications    Prior to Admission medications   Medication  Sig Start Date End Date Taking? Authorizing Provider  cyanocobalamin 500 MCG tablet Take 500 mcg by mouth daily.    [provider]  ferrous sulfate 325 (65 FE) MG tablet Take 325 mg by mouth daily with breakfast.    [provider]  meloxicam (MOBIC) 7.5 MG tablet Take 1-2 tabs PO daily x 15 days 01/08/18   Danton Clap, PA-C  Multiple Vitamins-Minerals (MULTIVITAMIN WITH MINERALS) tablet Take 1 tablet by mouth daily.    [provider]    Family History Family History  Problem Relation Age of Onset  . Pulmonary embolism Father   . Gout Father     Social History Social History   Tobacco Use  . Smoking status: Never Smoker  . Smokeless tobacco: Never Used  Substance Use Topics  . Alcohol use: No  . Drug use: No     Allergies   Sulfa antibiotics   Review of Systems Review of Systems  Constitutional: Negative for activity change, chills, fatigue and fever.  Respiratory: Negative for cough and shortness of breath.   Cardiovascular: Negative for chest pain and leg swelling.  Gastrointestinal: Negative for abdominal pain, nausea and vomiting.  Genitourinary: Positive for difficulty urinating, frequency and urgency. Negative for decreased urine volume, discharge, dysuria, flank pain, genital sores, hematuria, penile pain, penile swelling, scrotal swelling and testicular pain.  Musculoskeletal: Positive for arthralgias (right knee) and joint swelling (posterior right knee). Negative for back pain.  Skin: Negative for color change, rash and wound.  Neurological: Negative for dizziness,  weakness and numbness.  Hematological: Negative for adenopathy.     Physical Exam Triage Vital Signs ED Triage Vitals  Enc Vitals Group     BP 01/08/18 1429 123/73     Pulse Rate 01/08/18 1429 93     Resp --      Temp 01/08/18 1429 98.6 F (37 C)     Temp Source 01/08/18 1429 Oral     SpO2 01/08/18 1429 95 %     Weight 01/08/18 1427 (!) 340 lb (154.2 kg)      Height 01/08/18 1427 6' 1"  (1.854 m)     Head Circumference --      Peak Flow --      Pain Score 01/08/18 1427 6     Pain Loc --      Pain Edu? --      Excl. in [place]? --    No data found.  Updated Vital Signs BP 123/73 (BP Location: Right Arm)   Pulse 93   Temp 98.6 F (37 C) (Oral)   Ht 6' 1"  (1.854 m)   Wt (!) 340 lb (154.2 kg)   SpO2 95%   BMI 44.86 kg/m        Physical Exam  Constitutional: He is oriented to person, place, and time. He appears well-developed and well-nourished. No distress.  obese  HENT:  Head: Normocephalic and atraumatic.  Eyes: Conjunctivae are normal. Right eye exhibits no discharge. Left eye exhibits no discharge. No scleral icterus.  Cardiovascular: Normal rate, regular rhythm and normal heart sounds.  No murmur heard. Pulmonary/Chest: Effort normal and breath sounds normal. No respiratory distress. He has no wheezes.  Musculoskeletal:  RIGHT KNEE: There is mild posterior joint swelling. No deformity. Normal gait. Mild TTP of posterior aspect of knee only. Reduced ROM past 90 degrees flexion due to discomfort, full extension. Unable to perform anterior drawer/posterior drawer due to patient body habitus. 5/5 strength and NVI  Neurological: He is alert and oriented to person, place, and time.  Skin: Skin is warm and dry. No rash noted. No erythema.  Nursing note and vitals reviewed.    UC Treatments / Results  Labs (all labs ordered are listed, but only abnormal results are displayed) Labs Reviewed  URINE CULTURE - Abnormal; Notable for the following components:      Result Value   Culture   (*)    Value: <10,000 COLONIES/mL INSIGNIFICANT GROWTH Performed at [facility], [address] [place], [place] [address]    All other components within normal limits  URINALYSIS, COMPLETE (UACMP) WITH MICROSCOPIC    EKG None  Radiology No results found.  Procedures Procedures (including critical care time)  Medications Ordered in  UC Medications - No data to display  Initial Impression / Assessment and Plan / UC Course  I have reviewed the triage vital signs and the nursing notes.  Pertinent labs & imaging results that were available during my care of the patient were reviewed by me and considered in my medical decision making (see chart for details).    Final Clinical Impressions(s) / UC Diagnoses   Final diagnoses:  Posterior right knee pain  Difficulty urinating  Urinary frequency     Discharge Instructions     KNEE PAIN: Based on your presentation there is likely underlying arthritis of the knee and possible synovial cyst. Begin meloxicam daily. Stressed avoiding painful activities . PRICE guidelines reviewed. Use medications as directed. Take NSAIDs for pain and inflammation. F/u with ortho  or PCP for reexamination or our office sooner for any questions or concerns you may have and we will be happy to help you! If this worsens or continues over the next 2 weeks you may require imaging of the knee such as ultrasound or MRI  DIFFICULTY URINATING: Your urinalysis today was normal. You are advised to make a follow up appointment with your PCP for routine blood work and to have a PSA test to assess for enlarged prostate and other prostate issues. We will send your urine for culture and treat you with antibiotics if there is a UTI, but this is unlikely due to the normal Urinalysis. F/u with our office or ER if condition suddenly changes or worsens before appointment with your PCP    ED Prescriptions    Medication Sig Dispense Auth. Provider   meloxicam (MOBIC) 7.5 MG tablet Take 1-2 tabs PO daily x 15 days 30 tablet Laurene Footman B, PA-C     Controlled Substance Prescriptions [place] Controlled Substance Registry consulted? Not Applicable   Gretta Cool 01/09/18 1738

## 2018-01-09 LAB — URINE CULTURE: Culture: <10000 — AB

## 2018-04-10 ENCOUNTER — Telehealth: Payer: Self-pay | Admitting: Family Medicine

## 2018-04-10 ENCOUNTER — Ambulatory Visit (INDEPENDENT_AMBULATORY_CARE_PROVIDER_SITE_OTHER): Payer: BLUE CROSS/BLUE SHIELD | Admitting: Internal Medicine

## 2018-04-10 ENCOUNTER — Ambulatory Visit
Admission: RE | Admit: 2018-04-10 | Discharge: 2018-04-10 | Disposition: A | Discharge status: Home / Self Care | Payer: BLUE CROSS/BLUE SHIELD | Source: Ambulatory Visit | Attending: Internal Medicine | Admitting: Internal Medicine

## 2018-04-10 ENCOUNTER — Encounter: Payer: Self-pay | Admitting: Internal Medicine

## 2018-04-10 VITALS — BP 126/62 | HR 102 | Ht 73.0 in | Wt 347.0 lb

## 2018-04-10 DIAGNOSIS — M5432 Sciatica, left side: Secondary | ICD-10-CM

## 2018-04-10 DIAGNOSIS — M25561 Pain in right knee: Secondary | ICD-10-CM

## 2018-04-10 DIAGNOSIS — M7121 Synovial cyst of popliteal space [Baker], right knee: Secondary | ICD-10-CM | POA: Insufficient documentation

## 2018-04-10 DIAGNOSIS — K50118 Crohn's disease of large intestine with other complication: Secondary | ICD-10-CM | POA: Diagnosis not present

## 2018-04-10 DIAGNOSIS — M7989 Other specified soft tissue disorders: Secondary | ICD-10-CM | POA: Insufficient documentation

## 2018-04-10 MED ORDER — MELOXICAM 15 MG PO TABS: 15.0000 mg | ORAL_TABLET | Freq: Every day | ORAL | 2 refills | Status: DC

## 2018-04-10 NOTE — Telephone Encounter (Signed)
Received call from after-hours line about STAT DVT u/s results around 6:30 pm - neg for DVT and pos for complex baker's cyst. Called patient's cell at that time and reviewed results with him and told him that his PCP would be in touch about any next steps/follow up and answer further questions

## 2018-04-10 NOTE — Progress Notes (Signed)
Date:  04/10/2018   Name:  Caleb Banks.   DOB:  1960/08/31   MRN:  [id number]   Chief Complaint: Knee Pain (Right knee pain and swelling since labor day. Seen UC. Didn't find anything wrong. Was given meds but no relief. Had fall and fell on both knees 09/26th and has not felt right since. Right knee is painful and left leg is weak.  )  Knee Pain   There was no injury mechanism (started three months ago). The pain is present in the right knee. The quality of the pain is described as aching. The pain is mild. The pain has been fluctuating since onset. He has tried NSAIDs for the symptoms. The treatment provided no relief.  He has had more pain and swelling in the right knee since the fall.  Leg weakness - on the left he has some weakness after a fall on 9/26.  He went to [facility] ED and was diagnosed with sciatica.  He was prescribed prednisone, muscle relaxants and Mobic.  His strength is returning and he has little pain in the left knee.  Crohn's Colitis - has rectal stricture so keeps hydrated and stools soft for easy passage.  No bleeding noted.  Seen last year by GI - needs to have follow up this coming year.  Review of Systems  Constitutional: Negative for chills, fatigue and fever.  Respiratory: Negative for cough, chest tightness and shortness of breath.   Cardiovascular: Positive for leg swelling. Negative for chest pain and palpitations.  Gastrointestinal: Positive for rectal pain. Negative for abdominal pain, blood in stool, constipation and diarrhea.  Musculoskeletal: Positive for arthralgias, back pain, joint swelling and myalgias.  Skin: Negative for color change and rash.  Allergic/Immunologic: Negative for environmental allergies.  Neurological: Negative for dizziness, light-headedness and headaches.  Psychiatric/Behavioral: Negative for dysphoric mood and sleep disturbance.    Patient Active Problem List   Diagnosis Date Noted  . Crohn's colitis ([place]) 02/16/2017  .  Pain of both hip joints 02/16/2017  . Microcytic anemia 02/16/2017  . Sciatica of left side 02/16/2017  . Episodic lightheadedness 02/16/2017    Allergies  Allergen Reactions  . Sulfa Antibiotics     Past Surgical History:  Procedure Laterality Date  . COLONOSCOPY WITH PROPOFOL N/A 04/08/2017   Procedure: COLONOSCOPY WITH PROPOFOL;  Surgeon: Lollie Sails, MD;  Location[facility] ENDOSCOPY;  Service: Endoscopy;  Laterality: N/A;  . ESOPHAGOGASTRODUODENOSCOPY (EGD) WITH PROPOFOL N/A 04/08/2017   Procedure: ESOPHAGOGASTRODUODENOSCOPY (EGD) WITH PROPOFOL;  Surgeon: Lollie Sails, MD;  Location: [facility] ENDOSCOPY;  Service: Endoscopy;  Laterality: N/A;    Social History   Tobacco Use  . Smoking status: Never Smoker  . Smokeless tobacco: Never Used  Substance Use Topics  . Alcohol use: No  . Drug use: No     Medication list has been reviewed and updated.  No outpatient medications have been marked as taking for the 04/10/18 encounter (Office Visit) with Glean Hess, MD.    [facility] 2/9 Scores 04/10/2018 02/16/2017  PHQ - 2 Score 0 0    Physical Exam Vitals signs and nursing note reviewed.  Constitutional:      General: He is not in acute distress.    Appearance: He is well-developed.  HENT:     Head: Normocephalic and atraumatic.  Cardiovascular:     Rate and Rhythm: Normal rate and regular rhythm.  Pulmonary:     Effort: Pulmonary effort is normal. No respiratory distress.  Breath sounds: Normal breath sounds.  Musculoskeletal:     Right knee: He exhibits decreased range of motion, swelling and effusion. Tenderness (posterior knee) found.     Left knee: He exhibits normal range of motion, no swelling and no effusion.     Lumbar back: He exhibits no tenderness and no spasm.     Right lower leg: Edema present.     Left lower leg: No edema.       Legs:     Comments: Right calf 19 inches Left calf 17.5 inches  Skin:    General: Skin is warm and dry.      Findings: No rash.  Neurological:     Mental Status: He is alert and oriented to person, place, and time.     Sensory: Sensation is intact.     Motor: Motor function is intact.     Deep Tendon Reflexes:     Reflex Scores:      Patellar reflexes are 1+ on the right side and 1+ on the left side. Psychiatric:        Mood and Affect: Mood normal.        Behavior: Behavior normal.        Thought Content: Thought content normal.     BP 126/62 (BP Location: Right Arm, Patient Position: Sitting, Cuff Size: Large)   Pulse (!) 102   Ht 6' 1"  (1.854 m)   Wt (!) 347 lb (157.4 kg)   SpO2 97%   BMI 45.78 kg/m   Assessment and Plan: 1. Right leg swelling To be done ASAP - US Venous Img Lower Unilateral Right; Future  2. Arthralgia of right knee Begin Mobic and refer to Orthopedics - meloxicam (MOBIC) 15 MG tablet; Take 1 tablet (15 mg total) by mouth daily.  Dispense: 30 tablet; Refill: 2 - Ambulatory referral to Orthopedic Surgery  3. Crohn's disease of colon without complication ([place]) With strictures Recommend follow up with GI in the near future  4. Sciatica of left side Improving after steroid taper If worsening again, will need further imaging   Partially dictated using Dragon software. Any errors are unintentional.  Halina Maidens, MD [facility]  04/10/2018

## 2018-08-30 ENCOUNTER — Ambulatory Visit (INDEPENDENT_AMBULATORY_CARE_PROVIDER_SITE_OTHER): Payer: BLUE CROSS/BLUE SHIELD | Admitting: Internal Medicine

## 2018-08-30 ENCOUNTER — Other Ambulatory Visit: Payer: Self-pay

## 2018-08-30 ENCOUNTER — Encounter: Payer: Self-pay | Admitting: Internal Medicine

## 2018-08-30 VITALS — BP 128/60 | HR 84 | Temp 98.4°F | Ht 73.0 in | Wt 348.0 lb

## 2018-08-30 DIAGNOSIS — H5789 Other specified disorders of eye and adnexa: Secondary | ICD-10-CM

## 2018-08-30 MED ORDER — NEOMYCIN-POLYMYXIN-DEXAMETH 3.5-10000-0.1 OP SUSP: 2.0000 [drp] | Freq: Four times a day (QID) | OPHTHALMIC | 0 refills | Status: DC

## 2018-08-30 NOTE — Progress Notes (Signed)
Date:  08/30/2018   Name:  Caleb Banks.   DOB:  Sep 03, 1960   MRN:  [id number]   Chief Complaint: Conjunctivitis (Rt eye. X 1 week. Scratched eye 3 weeks. )  Eye Problem   The right eye is affected. This is a new problem. The current episode started 1 to 4 weeks ago. The problem occurs constantly. The problem has been unchanged. The injury mechanism was a direct trauma (poked by a finger). The pain is at a severity of 0/10. The patient is experiencing no pain. There is no known exposure to pink eye. He does not wear contacts. Associated symptoms include an eye discharge, eye redness and itching. Pertinent negatives include no blurred vision, fever, photophobia or recent URI. He has tried water for the symptoms. The treatment provided no relief.    Review of Systems  Constitutional: Negative for chills, fatigue and fever.  HENT: Negative for trouble swallowing.   Eyes: Positive for discharge, redness and itching. Negative for blurred vision, photophobia and visual disturbance.  Respiratory: Negative for chest tightness and shortness of breath.   Cardiovascular: Negative for chest pain.  Allergic/Immunologic: Negative for environmental allergies.    Patient Active Problem List   Diagnosis Date Noted  . Arthralgia of right knee 04/10/2018  . Crohn's colitis, other complication ([place]) 10/20/9483  . Pain of both hip joints 02/16/2017  . Microcytic anemia 02/16/2017  . Sciatica of left side 02/16/2017  . Episodic lightheadedness 02/16/2017    Allergies  Allergen Reactions  . Sulfa Antibiotics     Past Surgical History:  Procedure Laterality Date  . COLONOSCOPY WITH PROPOFOL N/A 04/08/2017   Procedure: COLONOSCOPY WITH PROPOFOL;  Surgeon: Lollie Sails, MD;  Location: [facility] ENDOSCOPY;  Service: Endoscopy;  Laterality: N/A;  . ESOPHAGOGASTRODUODENOSCOPY (EGD) WITH PROPOFOL N/A 04/08/2017   Procedure: ESOPHAGOGASTRODUODENOSCOPY (EGD) WITH PROPOFOL;  Surgeon: Lollie Sails, MD;  Location: [facility] ENDOSCOPY;  Service: Endoscopy;  Laterality: N/A;    Social History   Tobacco Use  . Smoking status: Never Smoker  . Smokeless tobacco: Never Used  Substance Use Topics  . Alcohol use: No  . Drug use: No     Medication list has been reviewed and updated.  Current Meds  Medication Sig  . meloxicam (MOBIC) 15 MG tablet Take 1 tablet (15 mg total) by mouth daily.    PHQ 2/9 Scores 08/30/2018 04/10/2018 02/16/2017  PHQ - 2 Score 0 0 0    BP Readings from Last 3 Encounters:  08/30/18 128/60  04/10/18 126/62  01/08/18 123/73    Physical Exam Vitals signs and nursing note reviewed.  Constitutional:      General: He is not in acute distress.    Appearance: He is well-developed.  HENT:     Head: Normocephalic and atraumatic.  Eyes:     General: Lids are normal.     Extraocular Movements: Extraocular movements intact.     Conjunctiva/sclera:     Right eye: Chemosis present. No exudate or hemorrhage.    Left eye: No chemosis, exudate or hemorrhage. Cardiovascular:     Rate and Rhythm: Normal rate and regular rhythm.  Pulmonary:     Effort: Pulmonary effort is normal. No respiratory distress.     Breath sounds: Normal breath sounds. No wheezing or rhonchi.  Musculoskeletal: Normal range of motion.  Lymphadenopathy:     Cervical: No cervical adenopathy.  Skin:    General: Skin is warm and dry.     Findings:  No rash.  Neurological:     Mental Status: He is alert and oriented to person, place, and time.  Psychiatric:        Behavior: Behavior normal.        Thought Content: Thought content normal.     Wt Readings from Last 3 Encounters:  08/30/18 (!) 348 lb (157.9 kg)  04/10/18 (!) 347 lb (157.4 kg)  01/08/18 (!) 340 lb (154.2 kg)    BP 128/60   Pulse 84   Temp 98.4 F (36.9 C) (Oral)   Ht 6' 1"  (1.854 m)   Wt (!) 348 lb (157.9 kg)   SpO2 96%   BMI 45.91 kg/m   Assessment and Plan: 1. Redness of eye, right Warm compresses as  needed Doubt corneal abrasion - pt to follow up if no improvement - neomycin-polymyxin b-dexamethasone (MAXITROL) 3.5-10000-0.1 SUSP; Place 2 drops into both eyes every 6 (six) hours for 10 days.  Dispense: 5 mL; Refill: 0   Partially dictated using Editor, commissioning. Any errors are unintentional.  Halina Maidens, MD [facility]  08/30/2018

## 2018-09-23 ENCOUNTER — Other Ambulatory Visit: Payer: Self-pay | Admitting: Internal Medicine

## 2018-09-23 DIAGNOSIS — M25561 Pain in right knee: Secondary | ICD-10-CM

## 2018-09-27 ENCOUNTER — Ambulatory Visit: Payer: Self-pay | Admitting: Internal Medicine

## 2018-10-19 ENCOUNTER — Other Ambulatory Visit: Payer: Self-pay | Admitting: Internal Medicine

## 2018-10-19 DIAGNOSIS — H5789 Other specified disorders of eye and adnexa: Secondary | ICD-10-CM

## 2018-10-20 ENCOUNTER — Other Ambulatory Visit: Payer: Self-pay | Admitting: Internal Medicine

## 2018-10-20 DIAGNOSIS — H5789 Other specified disorders of eye and adnexa: Secondary | ICD-10-CM

## 2018-12-17 ENCOUNTER — Encounter: Payer: Self-pay | Admitting: Internal Medicine

## 2018-12-18 ENCOUNTER — Other Ambulatory Visit: Payer: Self-pay

## 2018-12-18 ENCOUNTER — Ambulatory Visit (INDEPENDENT_AMBULATORY_CARE_PROVIDER_SITE_OTHER): Payer: BC Managed Care – PPO | Admitting: Internal Medicine

## 2018-12-18 ENCOUNTER — Encounter: Payer: Self-pay | Admitting: Internal Medicine

## 2018-12-18 VITALS — BP 120/72 | HR 78 | Ht 73.0 in | Wt 319.0 lb

## 2018-12-18 DIAGNOSIS — Z23 Encounter for immunization: Secondary | ICD-10-CM

## 2018-12-18 DIAGNOSIS — Z125 Encounter for screening for malignant neoplasm of prostate: Secondary | ICD-10-CM

## 2018-12-18 DIAGNOSIS — K624 Stenosis of anus and rectum: Secondary | ICD-10-CM

## 2018-12-18 DIAGNOSIS — K50118 Crohn's disease of large intestine with other complication: Secondary | ICD-10-CM

## 2018-12-18 DIAGNOSIS — Z0000 Encounter for general adult medical examination without abnormal findings: Secondary | ICD-10-CM | POA: Diagnosis not present

## 2018-12-18 DIAGNOSIS — Z1159 Encounter for screening for other viral diseases: Secondary | ICD-10-CM

## 2018-12-18 LAB — POCT URINALYSIS DIPSTICK
Bilirubin, UA: NEGATIVE
Blood, UA: NEGATIVE
Glucose, UA: NEGATIVE
Ketones, UA: NEGATIVE
Leukocytes, UA: NEGATIVE
Nitrite, UA: NEGATIVE
Protein, UA: POSITIVE — AB
Spec Grav, UA: 1.015 (ref 1.010–1.025)
Urobilinogen, UA: 0.2 E.U./dL
pH, UA: 6.5 (ref 5.0–8.0)

## 2018-12-18 NOTE — Progress Notes (Signed)
Date:  12/18/2018   Name:  Caleb Banks.   DOB:  11-07-1960   MRN:  [id number]   Chief Complaint: Annual Exam Caleb Banks. is a 58 y.o. male who presents today for his Complete Annual Exam. He feels well. 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 He has lost about 30 lbs by stopping soda and cutting back on carbs.  Tdap due Flu vaccine declined  Colonoscopy 2018 - rectal stricture and masslike process of the transverse colon - [facility] recommended surgical intervention 04/2017.  Crohn's disease - he has mild episodic flares with cramping, gas and mucous discharge.  He manages the stricture by drinking lots of water and keeping his stools soft. He was not really aware of the CT findings and recommendation for resection.  He is agreeable to another consultation at [facility].  HPI  Review of Systems  Constitutional: Positive for unexpected weight change (with diet changes). Negative for appetite change, chills, diaphoresis and fatigue.  HENT: Negative for hearing loss, tinnitus, trouble swallowing and voice change.   Eyes: Negative for visual disturbance.  Respiratory: Negative for choking, shortness of breath and wheezing.   Cardiovascular: Negative for chest pain, palpitations and leg swelling.  Gastrointestinal: Negative for abdominal pain, blood in stool, constipation, diarrhea and rectal pain.  Genitourinary: Negative for difficulty urinating, dysuria and frequency.  Musculoskeletal: Negative for arthralgias, back pain and myalgias.  Skin: Negative for color change and rash.  Allergic/Immunologic: Negative for environmental allergies.  Neurological: Negative for dizziness, syncope and headaches.  Hematological: Negative for adenopathy.  Psychiatric/Behavioral: Negative for dysphoric mood and sleep disturbance. The patient is not nervous/anxious.     Patient Active Problem List   Diagnosis Date Noted  . Arthralgia of right  knee 04/10/2018  . Stenosis of rectum and anus 05/10/2017  . Crohn's colitis, other complication ([place]) 53/61/4431  . Pain of both hip joints 02/16/2017  . Microcytic anemia 02/16/2017  . Sciatica of left side 02/16/2017  . Episodic lightheadedness 02/16/2017    Allergies  Allergen Reactions  . Sulfa Antibiotics     Past Surgical History:  Procedure Laterality Date  . COLONOSCOPY WITH PROPOFOL N/A 04/08/2017   Procedure: COLONOSCOPY WITH PROPOFOL;  Surgeon: Lollie Sails, MD;  Location[facility] ENDOSCOPY;  Service: Endoscopy;  Laterality: N/A;  . ESOPHAGOGASTRODUODENOSCOPY (EGD) WITH PROPOFOL N/A 04/08/2017   Procedure: ESOPHAGOGASTRODUODENOSCOPY (EGD) WITH PROPOFOL;  Surgeon: Lollie Sails, MD;  Location: [facility] ENDOSCOPY;  Service: Endoscopy;  Laterality: N/A;    Social History   Tobacco Use  . Smoking status: Never Smoker  . Smokeless tobacco: Never Used  Substance Use Topics  . Alcohol use: No  . Drug use: No     Medication list has been reviewed and updated.  No outpatient medications have been marked as taking for the 12/18/18 encounter (Office Visit) with Glean Hess, MD.    [facility] 2/9 Scores 12/18/2018 08/30/2018 04/10/2018 02/16/2017  PHQ - 2 Score 0 0 0 0  PHQ- 9 Score 1 - - -    BP Readings from Last 3 Encounters:  12/18/18 120/72  08/30/18 128/60  04/10/18 126/62    Physical Exam Vitals signs and nursing note reviewed.  Constitutional:      Appearance: Normal appearance. He is well-developed.  HENT:     Head: Normocephalic.     Right Ear: Tympanic membrane, ear canal and external ear normal.     Left Ear: Tympanic  membrane, ear canal and external ear normal.     Nose: Nose normal.     Mouth/Throat:     Pharynx: Uvula midline.  Eyes:     Conjunctiva/sclera: Conjunctivae normal.     Pupils: Pupils are equal, round, and reactive to light.  Neck:     Musculoskeletal: Normal range of motion and neck supple.     Thyroid: No thyromegaly.      Vascular: No carotid bruit.  Cardiovascular:     Rate and Rhythm: Normal rate and regular rhythm.     Heart sounds: Normal heart sounds.  Pulmonary:     Effort: Pulmonary effort is normal.     Breath sounds: Normal breath sounds. No wheezing.  Chest:     Breasts:        Right: No mass.        Left: No mass.  Abdominal:     General: Bowel sounds are normal.     Palpations: Abdomen is soft.     Tenderness: There is no abdominal tenderness. There is no guarding or rebound.     Hernia: No hernia is present.  Musculoskeletal: Normal range of motion.  Lymphadenopathy:     Cervical: No cervical adenopathy.  Skin:    General: Skin is warm and dry.     Capillary Refill: Capillary refill takes less than 2 seconds.  Neurological:     General: No focal deficit present.     Mental Status: He is alert and oriented to person, place, and time.     Deep Tendon Reflexes: Reflexes are normal and symmetric.  Psychiatric:        Speech: Speech normal.        Behavior: Behavior normal.        Thought Content: Thought content normal.        Judgment: Judgment normal.     Wt Readings from Last 3 Encounters:  12/18/18 (!) 319 lb (144.7 kg)  08/30/18 (!) 348 lb (157.9 kg)  04/10/18 (!) 347 lb (157.4 kg)    BP 120/72   Pulse 78   Ht 6' 1"  (1.854 m)   Wt (!) 319 lb (144.7 kg)   SpO2 98%   BMI 42.09 kg/m   Assessment and Plan: 1. Annual physical exam Normal exam except for weight which is decreasing Continue dietary changes - Comprehensive metabolic panel - Lipid panel - POCT urinalysis dipstick  2. Prostate cancer screening DRE deferred - PSA  3. Crohn's colitis, other complication ([place]) Clinically stable with intermittent symptoms but with abnormal CT that needs attention Pt will be referred back to [facility] for consultation - CBC with Differential/Platelet - Ambulatory referral to General Surgery  4. Stenosis of rectum and anus Managed by adequate fluid intake and high  fiber diet  5. Need for hepatitis C screening test - Hepatitis C antibody  6. Need for diphtheria-tetanus-pertussis (Tdap) vaccine - Tdap vaccine greater than or equal to 7yo IM   Partially dictated using Editor, commissioning. Any errors are unintentional.  Halina Maidens, MD [facility]  12/18/2018

## 2018-12-19 LAB — COMPREHENSIVE METABOLIC PANEL
ALT: 17 IU/L (ref 0–44)
AST: 15 IU/L (ref 0–40)
Albumin/Globulin Ratio: 0.8 — ABNORMAL LOW (ref 1.2–2.2)
Albumin: 3.4 g/dL — ABNORMAL LOW (ref 3.8–4.9)
Alkaline Phosphatase: 113 IU/L (ref 39–117)
BUN/Creatinine Ratio: 11 (ref 9–20)
BUN: 12 mg/dL (ref 6–24)
Bilirubin Total: 0.3 mg/dL (ref 0.0–1.2)
CO2: 22 mmol/L (ref 20–29)
Calcium: 9.1 mg/dL (ref 8.7–10.2)
Chloride: 98 mmol/L (ref 96–106)
Creatinine, Ser: 1.05 mg/dL (ref 0.76–1.27)
GFR calc Af Amer: 91 mL/min/{1.73_m2} (ref >59)
GFR calc non Af Amer: 79 mL/min/{1.73_m2} (ref >59)
Globulin, Total: 4.3 g/dL (ref 1.5–4.5)
Glucose: 97 mg/dL (ref 65–99)
Potassium: 4.4 mmol/L (ref 3.5–5.2)
Sodium: 137 mmol/L (ref 134–144)
Total Protein: 7.7 g/dL (ref 6.0–8.5)

## 2018-12-19 LAB — CBC WITH DIFFERENTIAL/PLATELET
Basophils Absolute: 0 10*3/uL (ref 0.0–0.2)
Basos: 1 %
EOS (ABSOLUTE): 0.2 10*3/uL (ref 0.0–0.4)
Eos: 3 %
Hematocrit: 33.2 % — ABNORMAL LOW (ref 37.5–51.0)
Hemoglobin: 10.5 g/dL — ABNORMAL LOW (ref 13.0–17.7)
Immature Grans (Abs): 0 10*3/uL (ref 0.0–0.1)
Immature Granulocytes: 0 %
Lymphocytes Absolute: 1.5 10*3/uL (ref 0.7–3.1)
Lymphs: 21 %
MCH: 20.7 pg — ABNORMAL LOW (ref 26.6–33.0)
MCHC: 31.6 g/dL (ref 31.5–35.7)
MCV: 66 fL — ABNORMAL LOW (ref 79–97)
Monocytes Absolute: 0.6 10*3/uL (ref 0.1–0.9)
Monocytes: 8 %
Neutrophils Absolute: 4.9 10*3/uL (ref 1.4–7.0)
Neutrophils: 67 %
Platelets: 367 10*3/uL (ref 150–450)
RBC: 5.07 x10E6/uL (ref 4.14–5.80)
RDW: 18 % — ABNORMAL HIGH (ref 11.6–15.4)
WBC: 7.2 10*3/uL (ref 3.4–10.8)

## 2018-12-19 LAB — PSA: Prostate Specific Ag, Serum: 1.9 ng/mL (ref 0.0–4.0)

## 2018-12-19 LAB — HEPATITIS C ANTIBODY: Hep C Virus Ab: <0.1 s/co ratio (ref 0.0–0.9)

## 2018-12-19 LAB — LIPID PANEL
Chol/HDL Ratio: 3.4 ratio (ref 0.0–5.0)
Cholesterol, Total: 160 mg/dL (ref 100–199)
HDL: 47 mg/dL (ref >39)
LDL Calculated: 104 mg/dL — ABNORMAL HIGH (ref 0–99)
Triglycerides: 45 mg/dL (ref 0–149)
VLDL Cholesterol Cal: 9 mg/dL (ref 5–40)

## 2019-03-19 ENCOUNTER — Other Ambulatory Visit: Payer: Self-pay

## 2019-03-19 ENCOUNTER — Ambulatory Visit
Admission: RE | Admit: 2019-03-19 | Discharge: 2019-03-19 | Disposition: A | Discharge status: Home / Self Care | Payer: BC Managed Care – PPO | Attending: Internal Medicine | Admitting: Internal Medicine

## 2019-03-19 ENCOUNTER — Ambulatory Visit (INDEPENDENT_AMBULATORY_CARE_PROVIDER_SITE_OTHER): Payer: BC Managed Care – PPO | Admitting: Internal Medicine

## 2019-03-19 ENCOUNTER — Encounter: Payer: Self-pay | Admitting: Internal Medicine

## 2019-03-19 ENCOUNTER — Ambulatory Visit
Admission: RE | Admit: 2019-03-19 | Discharge: 2019-03-19 | Disposition: A | Discharge status: Home / Self Care | Payer: BC Managed Care – PPO | Source: Ambulatory Visit | Attending: Internal Medicine | Admitting: Internal Medicine

## 2019-03-19 VITALS — BP 108/80 | HR 105 | Ht 73.0 in | Wt 295.0 lb

## 2019-03-19 DIAGNOSIS — M25561 Pain in right knee: Secondary | ICD-10-CM

## 2019-03-19 DIAGNOSIS — K50118 Crohn's disease of large intestine with other complication: Secondary | ICD-10-CM | POA: Diagnosis not present

## 2019-03-19 DIAGNOSIS — H5789 Other specified disorders of eye and adnexa: Secondary | ICD-10-CM | POA: Diagnosis not present

## 2019-03-19 DIAGNOSIS — M7989 Other specified soft tissue disorders: Secondary | ICD-10-CM

## 2019-03-19 MED ORDER — NEOMYCIN-POLYMYXIN-DEXAMETH 3.5-10000-0.1 OP SUSP: 2.0000 [drp] | Freq: Four times a day (QID) | OPHTHALMIC | 0 refills | Status: AC

## 2019-03-19 MED ORDER — MELOXICAM 15 MG PO TABS: 15.0000 mg | ORAL_TABLET | Freq: Every day | ORAL | 2 refills | Status: DC

## 2019-03-19 NOTE — Progress Notes (Signed)
Date:  03/19/2019   Name:  Caleb Banks.   DOB:  12-Aug-1960   MRN:  [id number]   Chief Complaint: Knee Pain (Right knee pain. Swelling and pressure in foot. Started on Thursday. Does not have good range of motion in his knee or toes. )  Foot Injury  Incident onset: onset 2 weeks ago. There was no injury mechanism. The pain is present in the right heel. Quality: feels swollen and slightly numb. The pain is mild (mostly in the medial ankle when standing/walking). The pain has been fluctuating since onset. He has tried NSAIDs for the symptoms. The treatment provided moderate relief.  Knee Pain  Incident onset: 2 weeks ago. There was no injury mechanism. The pain is present in the right knee. The quality of the pain is described as aching. The pain is mild. The pain has been intermittent since onset. He has tried NSAIDs for the symptoms. The treatment provided moderate relief.  Eye Problem  Both eyes are affected.This is a recurrent problem. The current episode started in the past 7 days. The problem occurs intermittently. There was no injury mechanism. The pain is mild. There is no known exposure to pink eye. He does not wear contacts. Associated symptoms include eye redness. Pertinent negatives include no fever or weakness. He has tried eye drops for the symptoms. The treatment provided significant (but redness returns) relief.   Symptoms started after a 10 hour drive over the weekend 2 weeks ago.  He went to ER - Ddimer was elevated but RLE [place] was negative for DVT.  His knee and ankle were not imaged.  He was given no medication.  He was advised to have a repeat Ddimer or [place] in 2 weeks.  Lab Results  Component Value Date   CREATININE 1.05 12/18/2018   BUN 12 12/18/2018   NA 137 12/18/2018   K 4.4 12/18/2018   CL 98 12/18/2018   CO2 22 12/18/2018   Lab Results  Component Value Date   CHOL 160 12/18/2018   HDL 47 12/18/2018   LDLCALC 104 (H) 12/18/2018   TRIG 45 12/18/2018   CHOLHDL 3.4 12/18/2018   Lab Results  Component Value Date   TSH 2.446 01/26/2017   No results found for: HGBA1C   Review of Systems  Constitutional: Negative for chills, fatigue and fever.  HENT: Negative for trouble swallowing.   Eyes: Positive for redness. Negative for visual disturbance.  Respiratory: Negative for cough, chest tightness, shortness of breath and wheezing.   Cardiovascular: Positive for leg swelling. Negative for chest pain and palpitations.  Gastrointestinal: Negative for abdominal pain and blood in stool.  Musculoskeletal: Positive for arthralgias and joint swelling.  Neurological: Negative for dizziness, weakness, light-headedness and headaches.    Patient Active Problem List   Diagnosis Date Noted  . Arthralgia of right knee 04/10/2018  . Stenosis of rectum and anus 05/10/2017  . Crohn's colitis, other complication ([place]) 41/96/2229  . Pain of both hip joints 02/16/2017  . Microcytic anemia 02/16/2017  . Sciatica of left side 02/16/2017  . Episodic lightheadedness 02/16/2017    Allergies  Allergen Reactions  . Sulfa Antibiotics     Past Surgical History:  Procedure Laterality Date  . COLONOSCOPY WITH PROPOFOL N/A 04/08/2017   Procedure: COLONOSCOPY WITH PROPOFOL;  Surgeon: Lollie Sails, MD;  Location: [facility] ENDOSCOPY;  Service: Endoscopy;  Laterality: N/A;  . ESOPHAGOGASTRODUODENOSCOPY (EGD) WITH PROPOFOL N/A 04/08/2017   Procedure: ESOPHAGOGASTRODUODENOSCOPY (EGD) WITH PROPOFOL;  Surgeon:  Lollie Sails, MD;  Location: [facility] ENDOSCOPY;  Service: Endoscopy;  Laterality: N/A;    Social History   Tobacco Use  . Smoking status: Never Smoker  . Smokeless tobacco: Never Used  Substance Use Topics  . Alcohol use: No  . Drug use: No     Medication list has been reviewed and updated.  Current Meds  Medication Sig  . meloxicam (MOBIC) 15 MG tablet TAKE 1 TABLET BY MOUTH EVERY DAY (Patient taking differently: PRN)    PHQ 2/9 Scores  03/19/2019 12/18/2018 08/30/2018 04/10/2018  PHQ - 2 Score 0 0 0 0  PHQ- 9 Score - 1 - -    BP Readings from Last 3 Encounters:  03/19/19 108/80  12/18/18 120/72  08/30/18 128/60    Physical Exam Vitals signs and nursing note reviewed.  Constitutional:      General: He is not in acute distress.    Appearance: Normal appearance. He is well-developed.  HENT:     Head: Normocephalic and atraumatic.  Eyes:     General: Lids are normal.     Extraocular Movements: Extraocular movements intact.     Conjunctiva/sclera:     Right eye: Chemosis present. No exudate.    Left eye: Chemosis present. No exudate. Neck:     Musculoskeletal: Normal range of motion.     Vascular: No carotid bruit.  Cardiovascular:     Rate and Rhythm: Normal rate and regular rhythm.     Pulses: Normal pulses.     Heart sounds: No murmur.  Pulmonary:     Effort: Pulmonary effort is normal. No respiratory distress.     Breath sounds: No wheezing or rhonchi.  Abdominal:     General: Abdomen is flat.     Palpations: Abdomen is soft.  Musculoskeletal:     Right knee: He exhibits decreased range of motion, swelling, effusion (possible small effusion) and erythema (mild erythema). He exhibits no deformity and no laceration. No tenderness (but with lateral crepitus) found.     Right ankle: He exhibits swelling (mild swelling dorsum of foot). He exhibits normal range of motion, no ecchymosis and no deformity.     Left lower leg: No edema.     Comments: Right calf is larger than the left but non-tender, no cord, redness or warmth  Lymphadenopathy:     Cervical: No cervical adenopathy.  Skin:    General: Skin is warm and dry.     Findings: No rash.  Neurological:     Mental Status: He is alert and oriented to person, place, and time.  Psychiatric:        Behavior: Behavior normal.        Thought Content: Thought content normal.     Wt Readings from Last 3 Encounters:  03/19/19 295 lb (133.8 kg)  12/18/18 (!)  319 lb (144.7 kg)  08/30/18 (!) 348 lb (157.9 kg)    BP 108/80   Pulse (!) 105   Ht 6' 1"  (1.854 m)   Wt 295 lb (133.8 kg)   SpO2 97%   BMI 38.92 kg/m   Assessment and Plan: 1. Redness of eye, right Will treat again with drops If recurrent, recommend Ophthalmology exam - neomycin-polymyxin b-dexamethasone (MAXITROL) 3.5-10000-0.1 SUSP; Place 2 drops into both eyes every 6 (six) hours for 10 days.  Dispense: 5 mL; Refill: 0  2. Acute pain of right knee Suspect this is arthropathy associated with IBD Will get plain films and labs - DG Knee Complete 4  Views Right; Future - Sedimentation rate - Uric acid - meloxicam (MOBIC) 15 MG tablet; Take 1 tablet (15 mg total) by mouth daily.  Dispense: 30 tablet; Refill: 2  3. Right leg swelling Repeat D-dimer - D-Dimer, Quantitative  4. Crohn's colitis, other complication ([place]) Pt is seeing GI tomorrow Ask him to refer them to the labs and Xrays done today   Partially dictated using Dragon software. Any errors are unintentional.  Halina Maidens, MD [facility]  03/19/2019

## 2019-03-20 ENCOUNTER — Other Ambulatory Visit: Payer: Self-pay | Admitting: Internal Medicine

## 2019-03-20 LAB — D-DIMER, QUANTITATIVE: D-DIMER: 3.14 mg/L FEU — ABNORMAL HIGH (ref 0.00–0.49)

## 2019-03-20 LAB — SEDIMENTATION RATE: Sed Rate: 77 mm/hr — ABNORMAL HIGH (ref 0–30)

## 2019-03-20 LAB — URIC ACID: Uric Acid: 6.8 mg/dL (ref 3.7–8.6)

## 2019-05-11 HISTORY — PX: EXPLORATORY LAPAROTOMY: SUR591

## 2019-05-26 ENCOUNTER — Encounter: Payer: Self-pay | Admitting: Internal Medicine

## 2019-05-26 DIAGNOSIS — Z932 Ileostomy status: Secondary | ICD-10-CM | POA: Insufficient documentation

## 2019-05-26 MED ORDER — LOPERAMIDE HCL 2 MG PO CAPS
2.00 | ORAL_CAPSULE | ORAL | Status: DC
Start: 2019-05-26 — End: 2019-05-26

## 2019-05-26 MED ORDER — ONDANSETRON HCL 4 MG/2ML IJ SOLN
4.00 | INTRAMUSCULAR | Status: DC
Start: ? — End: 2019-05-26

## 2019-05-26 MED ORDER — PIPERACILLIN SOD-TAZOBACTAM SO 3.375 (3-0.375) G IV SOLR
3.38 | INTRAVENOUS | Status: DC
Start: 2019-05-26 — End: 2019-05-26

## 2019-05-26 MED ORDER — MELATONIN 3 MG PO TABS
3.00 | ORAL_TABLET | ORAL | Status: DC
Start: 2019-05-26 — End: 2019-05-26

## 2019-05-26 MED ORDER — PANTOPRAZOLE SODIUM 40 MG PO TBEC
40.00 | DELAYED_RELEASE_TABLET | ORAL | Status: DC
Start: 2019-05-27 — End: 2019-05-26

## 2019-05-26 MED ORDER — NALOXONE HCL 0.4 MG/ML IJ SOLN
0.40 | INTRAMUSCULAR | Status: DC
Start: ? — End: 2019-05-26

## 2019-05-26 MED ORDER — PHENOL 1.4 % MT LIQD
2.00 | OROMUCOSAL | Status: DC
Start: ? — End: 2019-05-26

## 2019-05-26 MED ORDER — SODIUM CHLORIDE FLUSH 0.9 % IV SOLN
10.00 | INTRAVENOUS | Status: DC
Start: 2019-05-26 — End: 2019-05-26

## 2019-05-26 MED ORDER — OXYCODONE HCL 5 MG PO TABS
5.00 | ORAL_TABLET | ORAL | Status: DC
Start: ? — End: 2019-05-26

## 2019-05-26 MED ORDER — HEPARIN SODIUM (PORCINE) 10000 UNIT/ML IJ SOLN
7500.00 | INTRAMUSCULAR | Status: DC
Start: 2019-05-26 — End: 2019-05-26

## 2019-05-26 MED ORDER — ACETAMINOPHEN 500 MG PO TABS
1000.00 | ORAL_TABLET | ORAL | Status: DC
Start: 2019-05-26 — End: 2019-05-26

## 2019-12-20 ENCOUNTER — Encounter: Payer: Self-pay | Admitting: Internal Medicine

## 2019-12-20 ENCOUNTER — Ambulatory Visit (INDEPENDENT_AMBULATORY_CARE_PROVIDER_SITE_OTHER): Payer: BC Managed Care – PPO | Admitting: Internal Medicine

## 2019-12-20 ENCOUNTER — Other Ambulatory Visit: Payer: Self-pay

## 2019-12-20 VITALS — BP 112/68 | HR 89 | Temp 97.9°F | Ht 73.0 in | Wt 304.0 lb

## 2019-12-20 DIAGNOSIS — K50118 Crohn's disease of large intestine with other complication: Secondary | ICD-10-CM

## 2019-12-20 DIAGNOSIS — N529 Male erectile dysfunction, unspecified: Secondary | ICD-10-CM

## 2019-12-20 DIAGNOSIS — Z932 Ileostomy status: Secondary | ICD-10-CM | POA: Diagnosis not present

## 2019-12-20 DIAGNOSIS — Z125 Encounter for screening for malignant neoplasm of prostate: Secondary | ICD-10-CM

## 2019-12-20 DIAGNOSIS — Z0000 Encounter for general adult medical examination without abnormal findings: Secondary | ICD-10-CM

## 2019-12-20 DIAGNOSIS — M25561 Pain in right knee: Secondary | ICD-10-CM

## 2019-12-20 LAB — POCT URINALYSIS DIPSTICK
Bilirubin, UA: NEGATIVE
Blood, UA: NEGATIVE
Glucose, UA: NEGATIVE
Ketones, UA: NEGATIVE
Leukocytes, UA: NEGATIVE
Nitrite, UA: NEGATIVE
Protein, UA: NEGATIVE
Spec Grav, UA: 1.015 (ref 1.010–1.025)
Urobilinogen, UA: 0.2 E.U./dL
pH, UA: 5 (ref 5.0–8.0)

## 2019-12-20 MED ORDER — SILDENAFIL CITRATE 25 MG PO TABS: 12.5000 mg | ORAL_TABLET | Freq: Every day | ORAL | 0 refills | Status: AC | PRN

## 2019-12-20 MED ORDER — MELOXICAM 15 MG PO TABS: 15.0000 mg | ORAL_TABLET | Freq: Every day | ORAL | 2 refills | Status: DC

## 2019-12-20 NOTE — Progress Notes (Signed)
Date:  12/20/2019   Name:  Caleb Banks.   DOB:  03/25/1961   MRN:  [id number]   Chief Complaint: Annual Exam  Caleb Banks. is a 59 y.o. male who presents today for his Complete Annual Exam. He feels well. He reports exercising - walking 2 x a week, 2 miles. He reports he is sleeping fairly well. He has not worked since last November; has not filed for disability.  Colonoscopy: 03/2017; now has ileostomy followed closely by GI at [facility] for anal stricture and ostomy  Immunization History  Administered Date(s) Administered  . Moderna SARS-COVID-2 Vaccination 11/17/2019, 12/15/2019  . Tdap 12/18/2018    HPI  Lab Results  Component Value Date   CREATININE 1.05 12/18/2018   BUN 12 12/18/2018   NA 137 12/18/2018   K 4.4 12/18/2018   CL 98 12/18/2018   CO2 22 12/18/2018   Lab Results  Component Value Date   CHOL 160 12/18/2018   HDL 47 12/18/2018   LDLCALC 104 (H) 12/18/2018   TRIG 45 12/18/2018   CHOLHDL 3.4 12/18/2018   Lab Results  Component Value Date   TSH 2.446 01/26/2017   No results found for: HGBA1C Lab Results  Component Value Date   WBC 7.2 12/18/2018   HGB 10.5 (L) 12/18/2018   HCT 33.2 (L) 12/18/2018   MCV 66 (L) 12/18/2018   PLT 367 12/18/2018   Lab Results  Component Value Date   ALT 17 12/18/2018   AST 15 12/18/2018   ALKPHOS 113 12/18/2018   BILITOT 0.3 12/18/2018     Review of Systems  Constitutional: Negative for appetite change, chills, diaphoresis, fatigue and unexpected weight change.  HENT: Negative for hearing loss, tinnitus, trouble swallowing and voice change.   Eyes: Negative for visual disturbance.  Respiratory: Negative for choking, shortness of breath and wheezing.   Cardiovascular: Negative for chest pain, palpitations and leg swelling.  Gastrointestinal: Negative for abdominal pain.  Genitourinary: Negative for difficulty urinating, dysuria and frequency.       Mild ED - erections are not as strong and do not last.   He is able to have intercourse but is not satisfied.  Musculoskeletal: Negative for arthralgias, back pain and myalgias.  Skin: Negative for color change and rash.  Neurological: Negative for dizziness, syncope and headaches.  Hematological: Negative for adenopathy.  Psychiatric/Behavioral: Negative for dysphoric mood and sleep disturbance.    Patient Active Problem List   Diagnosis Date Noted  . Ileostomy status ([place]) 05/26/2019  . Arthralgia of right knee 04/10/2018  . Stenosis of rectum and anus 05/10/2017  . Crohn's colitis, other complication ([place]) 65/68/1275  . Pain of both hip joints 02/16/2017  . Microcytic anemia 02/16/2017  . Sciatica of left side 02/16/2017  . Episodic lightheadedness 02/16/2017    Allergies  Allergen Reactions  . Sulfa Antibiotics     Past Surgical History:  Procedure Laterality Date  . COLONOSCOPY WITH PROPOFOL N/A 04/08/2017   Procedure: COLONOSCOPY WITH PROPOFOL;  Surgeon: Lollie Sails, MD;  Location: [facility] ENDOSCOPY;  Service: Endoscopy;  Laterality: N/A;  . ESOPHAGOGASTRODUODENOSCOPY (EGD) WITH PROPOFOL N/A 04/08/2017   Procedure: ESOPHAGOGASTRODUODENOSCOPY (EGD) WITH PROPOFOL;  Surgeon: Lollie Sails, MD;  Location: [facility] ENDOSCOPY;  Service: Endoscopy;  Laterality: N/A;  . EXPLORATORY LAPAROTOMY  05/11/2019   with ileostomy placement    Social History   Tobacco Use  . Smoking status: Never Smoker  . Smokeless tobacco: Never Used  Vaping Use  .  Vaping Use: Never used  Substance Use Topics  . Alcohol use: No  . Drug use: No     Medication list has been reviewed and updated.  Current Meds  Medication Sig  . ibuprofen (ADVIL) 400 MG tablet Take 400 mg by mouth every 6 (six) hours as needed.    PHQ 2/9 Scores 12/20/2019 03/19/2019 12/18/2018 08/30/2018  PHQ - 2 Score 0 0 0 0  PHQ- 9 Score 0 - 1 -    GAD 7 : Generalized Anxiety Score 12/20/2019  Nervous, Anxious, on Edge 0  Control/stop worrying 0  Worry too much -  different things 0  Trouble relaxing 0  Restless 0  Easily annoyed or irritable 0  Afraid - awful might happen 0  Total GAD 7 Score 0  Anxiety Difficulty Not difficult at all    BP Readings from Last 3 Encounters:  12/20/19 112/68  03/19/19 108/80  12/18/18 120/72    Physical Exam Vitals and nursing note reviewed.  Constitutional:      Appearance: Normal appearance. He is well-developed.  HENT:     Head: Normocephalic.     Right Ear: Tympanic membrane, ear canal and external ear normal.     Left Ear: Tympanic membrane, ear canal and external ear normal.     Nose: Nose normal.     Mouth/Throat:     Pharynx: Uvula midline.  Eyes:     Conjunctiva/sclera: Conjunctivae normal.     Pupils: Pupils are equal, round, and reactive to light.  Neck:     Thyroid: No thyromegaly.     Vascular: No carotid bruit.  Cardiovascular:     Rate and Rhythm: Normal rate and regular rhythm.     Heart sounds: Normal heart sounds.  Pulmonary:     Effort: Pulmonary effort is normal.     Breath sounds: Normal breath sounds. No wheezing.  Chest:     Breasts:        Right: No mass.        Left: No mass.  Abdominal:     General: The ostomy site is clean. Bowel sounds are normal.     Palpations: Abdomen is soft.     Tenderness: There is no abdominal tenderness. There is no guarding or rebound.     Comments: Soft brown stool in bag  Musculoskeletal:        General: Normal range of motion.     Cervical back: Normal range of motion and neck supple.     Right lower leg: No edema.     Left lower leg: No edema.  Lymphadenopathy:     Cervical: No cervical adenopathy.  Skin:    General: Skin is warm and dry.  Neurological:     Mental Status: He is alert and oriented to person, place, and time.     Deep Tendon Reflexes: Reflexes are normal and symmetric.  Psychiatric:        Mood and Affect: Mood normal.        Speech: Speech normal.        Behavior: Behavior normal.     Wt Readings from Last  3 Encounters:  12/20/19 (!) 321 lb (145.6 kg)  03/19/19 295 lb (133.8 kg)  12/18/18 (!) 319 lb (144.7 kg)    BP 112/68   Pulse 89   Temp 97.9 F (36.6 C) (Oral)   Ht 6' 1"  (1.854 m)   Wt (!) 321 lb (145.6 kg)   SpO2 96%   BMI 42.35  kg/m   Assessment and Plan: 1. Annual physical exam Exam is normal except for weight. Encourage regular exercise and appropriate dietary changes. - CBC with Differential/Platelet - Comprehensive metabolic panel - Lipid panel - POCT urinalysis dipstick  2. Crohn's colitis, other complication ([place]) Followed by GI Not being treated with any medication at this time  3. Prostate cancer screening DRE deferred due to rectal stricture - PSA  4. Ileostomy status ([place]) Functioning well without issues at this time  5. Erectile dysfunction, unspecified erectile dysfunction type Will try low dose viagra 12.5 mg with precautions given - sildenafil (VIAGRA) 25 MG tablet; Take 0.5 tablets (12.5 mg total) by mouth daily as needed for erectile dysfunction.  Dispense: 10 tablet; Refill: 0  6. Acute pain of right knee Continue mobic prn in place of ibuprofen - meloxicam (MOBIC) 15 MG tablet; Take 1 tablet (15 mg total) by mouth daily.  Dispense: 30 tablet; Refill: 2   Partially dictated using Editor, commissioning. Any errors are unintentional.  Halina Maidens, MD [facility]  12/20/2019

## 2019-12-20 NOTE — Patient Instructions (Signed)

## 2019-12-21 ENCOUNTER — Encounter: Payer: Self-pay | Admitting: Internal Medicine

## 2019-12-21 LAB — CBC WITH DIFFERENTIAL/PLATELET
Basophils Absolute: 0 10*3/uL (ref 0.0–0.2)
Basos: 1 %
EOS (ABSOLUTE): 0.3 10*3/uL (ref 0.0–0.4)
Eos: 4 %
Hematocrit: 35.5 % — ABNORMAL LOW (ref 37.5–51.0)
Hemoglobin: 11.1 g/dL — ABNORMAL LOW (ref 13.0–17.7)
Immature Grans (Abs): 0 10*3/uL (ref 0.0–0.1)
Immature Granulocytes: 0 %
Lymphocytes Absolute: 1.8 10*3/uL (ref 0.7–3.1)
Lymphs: 28 %
MCH: 22.4 pg — ABNORMAL LOW (ref 26.6–33.0)
MCHC: 31.3 g/dL — ABNORMAL LOW (ref 31.5–35.7)
MCV: 72 fL — ABNORMAL LOW (ref 79–97)
Monocytes Absolute: 0.6 10*3/uL (ref 0.1–0.9)
Monocytes: 9 %
Neutrophils Absolute: 3.8 10*3/uL (ref 1.4–7.0)
Neutrophils: 58 %
Platelets: 245 10*3/uL (ref 150–450)
RBC: 4.96 x10E6/uL (ref 4.14–5.80)
RDW: 20 % — ABNORMAL HIGH (ref 11.6–15.4)
WBC: 6.4 10*3/uL (ref 3.4–10.8)

## 2019-12-21 LAB — LIPID PANEL
Chol/HDL Ratio: 2.8 ratio (ref 0.0–5.0)
Cholesterol, Total: 173 mg/dL (ref 100–199)
HDL: 62 mg/dL (ref >39)
LDL Chol Calc (NIH): 102 mg/dL — ABNORMAL HIGH (ref 0–99)
Triglycerides: 46 mg/dL (ref 0–149)
VLDL Cholesterol Cal: 9 mg/dL (ref 5–40)

## 2019-12-21 LAB — COMPREHENSIVE METABOLIC PANEL
ALT: 31 IU/L (ref 0–44)
AST: 24 IU/L (ref 0–40)
Albumin/Globulin Ratio: 0.9 — ABNORMAL LOW (ref 1.2–2.2)
Albumin: 3.8 g/dL (ref 3.8–4.9)
Alkaline Phosphatase: 140 IU/L — ABNORMAL HIGH (ref 48–121)
BUN/Creatinine Ratio: 14 (ref 9–20)
BUN: 16 mg/dL (ref 6–24)
Bilirubin Total: 0.3 mg/dL (ref 0.0–1.2)
CO2: 21 mmol/L (ref 20–29)
Calcium: 9.1 mg/dL (ref 8.7–10.2)
Chloride: 105 mmol/L (ref 96–106)
Creatinine, Ser: 1.17 mg/dL (ref 0.76–1.27)
GFR calc Af Amer: 80 mL/min/{1.73_m2} (ref >59)
GFR calc non Af Amer: 69 mL/min/{1.73_m2} (ref >59)
Globulin, Total: 4.3 g/dL (ref 1.5–4.5)
Glucose: 90 mg/dL (ref 65–99)
Potassium: 4.9 mmol/L (ref 3.5–5.2)
Sodium: 139 mmol/L (ref 134–144)
Total Protein: 8.1 g/dL (ref 6.0–8.5)

## 2019-12-21 LAB — PSA: Prostate Specific Ag, Serum: 1.9 ng/mL (ref 0.0–4.0)

## 2020-02-04 DIAGNOSIS — E7800 Pure hypercholesterolemia, unspecified: Secondary | ICD-10-CM | POA: Insufficient documentation

## 2020-11-06 ENCOUNTER — Encounter: Payer: Self-pay | Admitting: *Deleted

## 2020-11-06 ENCOUNTER — Encounter: Payer: 59 | Attending: Geriatric Medicine | Admitting: *Deleted

## 2020-11-06 ENCOUNTER — Other Ambulatory Visit: Payer: Self-pay

## 2020-11-06 VITALS — BP 122/70 | Ht 73.0 in | Wt 344.2 lb

## 2020-11-06 DIAGNOSIS — E119 Type 2 diabetes mellitus without complications: Secondary | ICD-10-CM | POA: Diagnosis not present

## 2020-11-06 DIAGNOSIS — E669 Obesity, unspecified: Secondary | ICD-10-CM | POA: Insufficient documentation

## 2020-11-06 DIAGNOSIS — Z6841 Body Mass Index (BMI) 40.0 and over, adult: Secondary | ICD-10-CM | POA: Insufficient documentation

## 2020-11-06 DIAGNOSIS — E7800 Pure hypercholesterolemia, unspecified: Secondary | ICD-10-CM | POA: Diagnosis not present

## 2020-11-06 NOTE — Progress Notes (Signed)
Diabetes Self-Management Education  Visit Type: First/Initial  Appt. Start Time: 1335 Appt. End Time: 1740  11/06/2020  Mr. Caleb Banks, identified by name and date of birth, is a 60 y.o. male with a diagnosis of Diabetes: Type 2.   ASSESSMENT  Blood pressure 122/70, height 6' 1"  (1.854 m), weight (!) 344 lb 3.2 oz (156.1 kg). Body mass index is 45.41 kg/m.   Diabetes Self-Management Education - 11/06/20 1506       Visit Information   Visit Type First/Initial      Initial Visit   Diabetes Type Type 2    Are you currently following a meal plan? No    Are you taking your medications as prescribed? No   Not currently taking Metformin. Reports he wants to learn more about it.   Date Diagnosed 2 weeks ago      Health Coping   How would you rate your overall health? Fair      Psychosocial Assessment   Patient Belief/Attitude about Diabetes Denial    Self-care barriers None    Self-management support Doctor's office    Patient Concerns Nutrition/Meal planning;Glycemic Control;Medication;Monitoring;Weight Control;Healthy Lifestyle    Special Needs None    Preferred Learning Style Auditory;Visual;Hands on    [place] in progress    How often do you need to have someone help you when you read instructions, pamphlets, or other written materials from your doctor or pharmacy? 1 - Never    What is the last grade level you completed in school? Bachelor's of Science      Pre-Education Assessment   Patient understands the diabetes disease and treatment process. Needs Instruction    Patient understands incorporating nutritional management into lifestyle. Needs Instruction    Patient undertands incorporating physical activity into lifestyle. Needs Instruction    Patient understands using medications safely. Needs Instruction    Patient understands monitoring blood glucose, interpreting and using results Needs Review    Patient understands prevention, detection, and  treatment of acute complications. Needs Instruction    Patient understands prevention, detection, and treatment of chronic complications. Needs Instruction    Patient understands how to develop strategies to address psychosocial issues. Needs Instruction    Patient understands how to develop strategies to promote health/change behavior. Needs Instruction      Complications   Last HgB A1C per patient/outside source 7.6 %   10/22/2020   How often do you check your blood sugar? 3-4 times / week    Fasting Blood glucose range (mg/dL) 70-129   He reports last FBG was 125 mg/dL.   Postprandial Blood glucose range (mg/dL) 180-200   He checked BG in the office today and reading was 188 mg/dL at 2:30 pm - 2 hrs pp. He had chicken and rice from restaurant for lunch.   Have you had a dilated eye exam in the past 12 months? Yes    Have you had a dental exam in the past 12 months? Yes    Are you checking your feet? Yes    How many days per week are you checking your feet? 4      Dietary Intake   Breakfast chicken sausage and egg; cereal and almond milk    Snack (morning) 0-1 snacks - wheat crackers, chips    Lunch fish; salmon salad; chicken and veggies; corn beef and cabbage    Dinner chicken, fish, occasional steak; rice, pasta, beans, broccoli, cauliflower, squash, zucchini, spinach, salad with lettuce, tomatoes, onion, cuccumbers, avocado  Beverage(s) water, diet lemonade, juice, occasional soda, alkaline water      Exercise   Exercise Type ADL's      Patient Education   Previous Diabetes Education No    Disease state  Definition of diabetes, type 1 and 2, and the diagnosis of diabetes;Factors that contribute to the development of diabetes    Nutrition management  Role of diet in the treatment of diabetes and the relationship between the three main macronutrients and blood glucose level;Food label reading, portion sizes and measuring food.;Reviewed blood glucose goals for pre and post meals and  how to evaluate the patients' food intake on their blood glucose level.;Meal timing in regards to the patients' current diabetes medication.    Physical activity and exercise  Role of exercise on diabetes management, blood pressure control and cardiac health.    Medications Reviewed patients medication for diabetes, action, purpose, timing of dose and side effects.    Monitoring Purpose and frequency of SMBG.;Taught/discussed recording of test results and interpretation of SMBG.;Identified appropriate SMBG and/or A1C goals.    Chronic complications Relationship between chronic complications and blood glucose control    Psychosocial adjustment Identified and addressed patients feelings and concerns about diabetes      Individualized Goals (developed by patient)   Reducing Risk Other (comment)   improve blood sugars, decrease medications, prevent diabetes complications, lose weight, lead a healthier lifestyle, become more fit     Outcomes   Expected Outcomes Demonstrated interest in learning. Expect positive outcomes    Future DMSE 4-6 wks             Individualized Plan for Diabetes Self-Management Training:   Learning Objective:  Patient will have a greater understanding of diabetes self-management. Patient education plan is to attend individual and/or group sessions per assessed needs and concerns.   Plan:   Patient Instructions  Check blood sugars 1 x day before breakfast or 2 hrs after one meal every day Bring blood sugar records to the next class  Exercise: Walk as tolerated  Eat 3 meals day,   1-2  snacks a day Space meals 4-6 hours apart Avoid sugar sweetened drinks (soda, juices) Include 1 serving of protein when eating fruit for a snack  Expected Outcomes:  Demonstrated interest in learning. Expect positive outcomes  Education material provided:  General Meal Planning Guidelines Simple Meal Plan  If problems or questions, patient to contact team via:  Johny Drilling, RN, [place] [phone]  Future DSME appointment: 4-6 wks December 01, 2020 for Diabetes Class 1

## 2020-11-06 NOTE — Patient Instructions (Signed)
Check blood sugars 1 x day before breakfast or 2 hrs after one meal every day Bring blood sugar records to the next class  Exercise: Walk as tolerated  Eat 3 meals day,   1-2  snacks a day Space meals 4-6 hours apart Avoid sugar sweetened drinks (soda, juices) Include 1 serving of protein when eating fruit for a snack  Return for classes on:

## 2020-12-01 ENCOUNTER — Other Ambulatory Visit: Payer: Self-pay

## 2020-12-01 ENCOUNTER — Encounter: Payer: 59 | Attending: Geriatric Medicine | Admitting: Dietician

## 2020-12-01 VITALS — Ht 73.0 in | Wt 343.7 lb

## 2020-12-01 DIAGNOSIS — E119 Type 2 diabetes mellitus without complications: Secondary | ICD-10-CM | POA: Insufficient documentation

## 2020-12-01 DIAGNOSIS — Z713 Dietary counseling and surveillance: Secondary | ICD-10-CM | POA: Insufficient documentation

## 2020-12-01 DIAGNOSIS — E7800 Pure hypercholesterolemia, unspecified: Secondary | ICD-10-CM | POA: Diagnosis not present

## 2020-12-01 DIAGNOSIS — E669 Obesity, unspecified: Secondary | ICD-10-CM | POA: Insufficient documentation

## 2020-12-01 NOTE — Progress Notes (Signed)
Appt. Start Time: 1730 Appt. End Time: 2000  Class 1 Diabetes Overview - define DM; state own type of DM; identify functions of pancreas and insulin; define insulin deficiency vs insulin resistance  Psychosocial - identify DM as a source of stress; state the effects of stress on BG control  Nutritional Management - describe effects of food on blood glucose; identify sources of carbohydrate, protein and fat; verbalize the importance of balance meals in controlling blood glucose  Exercise - describe the effects of exercise on blood glucose and importance of regular exercise in controlling diabetes; state a plan for personal exercise; verbalize contraindications for exercise  Self-Monitoring - state importance of SMBG; use SMBG results to effectively manage diabetes; identify importance of regular HbA1C testing and goals for results  Acute Complications - recognize hyperglycemia and hypoglycemia with causes and effects; identify blood glucose results as high, low or in control; list steps in treating and preventing high and low blood glucose  Chronic Complications/Foot, Skin, Eye Dental Care - identify possible long-term complications of diabetes (retinopathy, neuropathy, nephropathy, cardiovascular disease, infections); state importance of daily self-foot exams; describe how to examine feet and what to look for; explain appropriate eye and dental care  Lifestyle Changes/Goals & Health/Community Resources - state benefits of making appropriate lifestyle changes; identify habits that need to change (meals, tobacco, alcohol); identify strategies to reduce risk factors for personal health  Pregnancy/Sexual Health - define gestational diabetes; state importance of good blood glucose control and birth control prior to pregnancy; state importance of good blood glucose control in preventing sexual problems (impotence, vaginal dryness, infections, loss of desire); state relationship of blood glucose control  and pregnancy outcome; describe risk of maternal and fetal complications  Teaching Materials Used: Class 1 Slides/Notebook Diabetes Booklet ID Card  Medic Alert/Medic ID Forms Sleep Evaluation Exercise Handout Planning a Balanced Meal Goals for Class 1

## 2020-12-08 ENCOUNTER — Encounter: Payer: 59 | Admitting: *Deleted

## 2020-12-08 ENCOUNTER — Encounter: Payer: Self-pay | Admitting: *Deleted

## 2020-12-08 ENCOUNTER — Other Ambulatory Visit: Payer: Self-pay

## 2020-12-08 VITALS — Wt 341.6 lb

## 2020-12-08 DIAGNOSIS — E669 Obesity, unspecified: Secondary | ICD-10-CM | POA: Diagnosis not present

## 2020-12-08 DIAGNOSIS — E119 Type 2 diabetes mellitus without complications: Secondary | ICD-10-CM

## 2020-12-08 NOTE — Progress Notes (Signed)

## 2020-12-15 ENCOUNTER — Encounter: Payer: Self-pay | Admitting: Dietician

## 2020-12-15 ENCOUNTER — Other Ambulatory Visit: Payer: Self-pay

## 2020-12-15 ENCOUNTER — Encounter: Payer: 59 | Admitting: Dietician

## 2020-12-15 VITALS — BP 120/82 | Ht 73.0 in | Wt 342.7 lb

## 2020-12-15 DIAGNOSIS — E669 Obesity, unspecified: Secondary | ICD-10-CM | POA: Diagnosis not present

## 2020-12-18 ENCOUNTER — Encounter: Payer: Self-pay | Admitting: *Deleted

## 2020-12-23 ENCOUNTER — Encounter: Payer: BC Managed Care – PPO | Admitting: Internal Medicine

## 2021-03-18 IMAGING — CR DG KNEE COMPLETE 4+V*R*
4 series · 4 of 4 positions shown · non-contrast
Comparison: None.

CLINICAL DATA: Right knee swelling and pain, no injury.

EXAM:
RIGHT KNEE - COMPLETE 4+ VIEW

[knee ap]
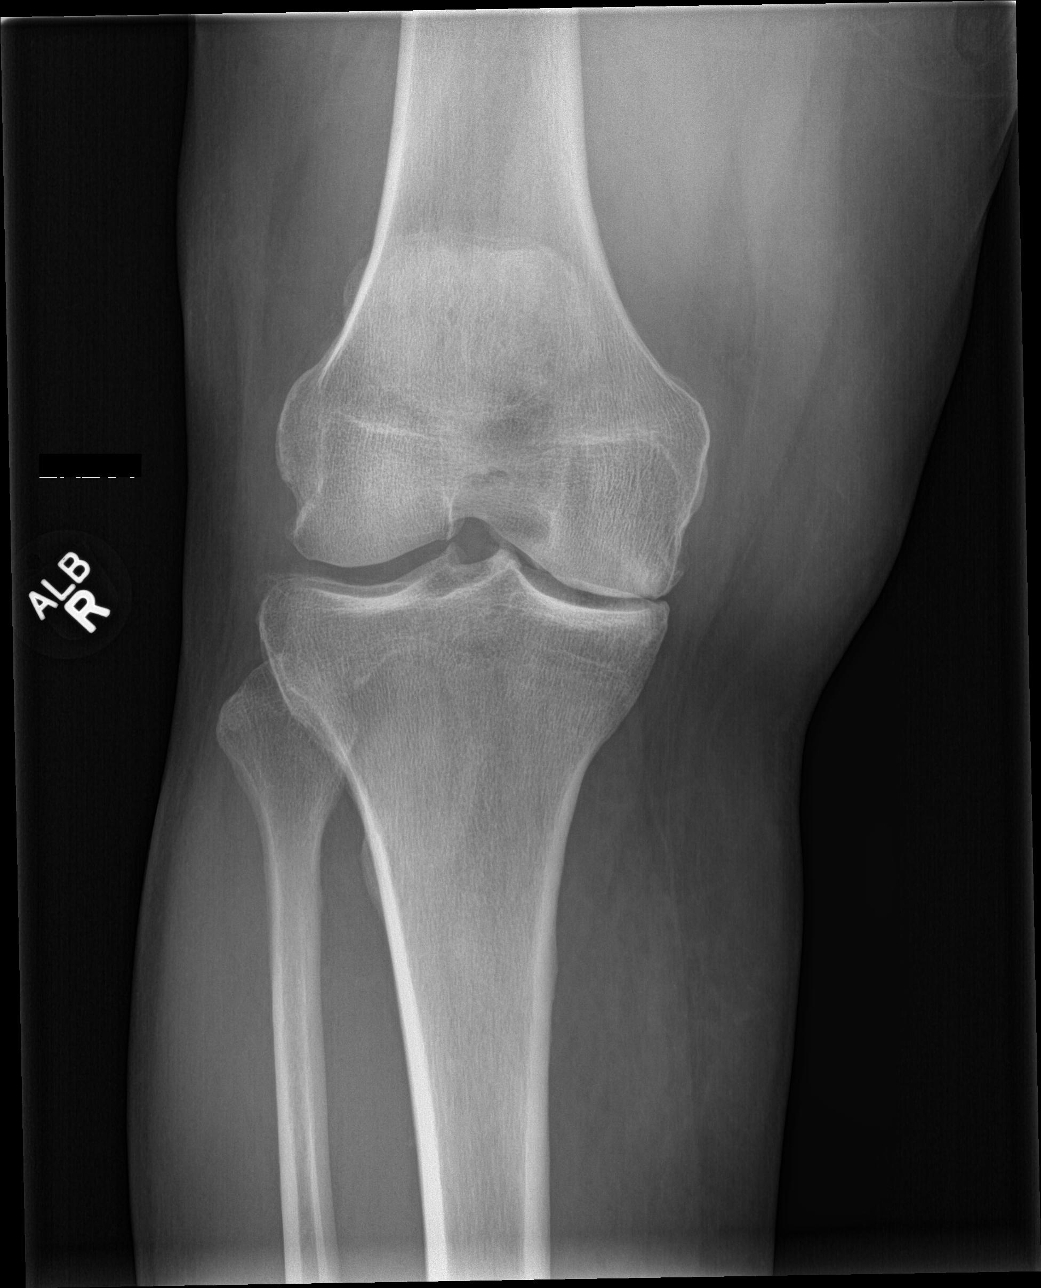

[knee lat]
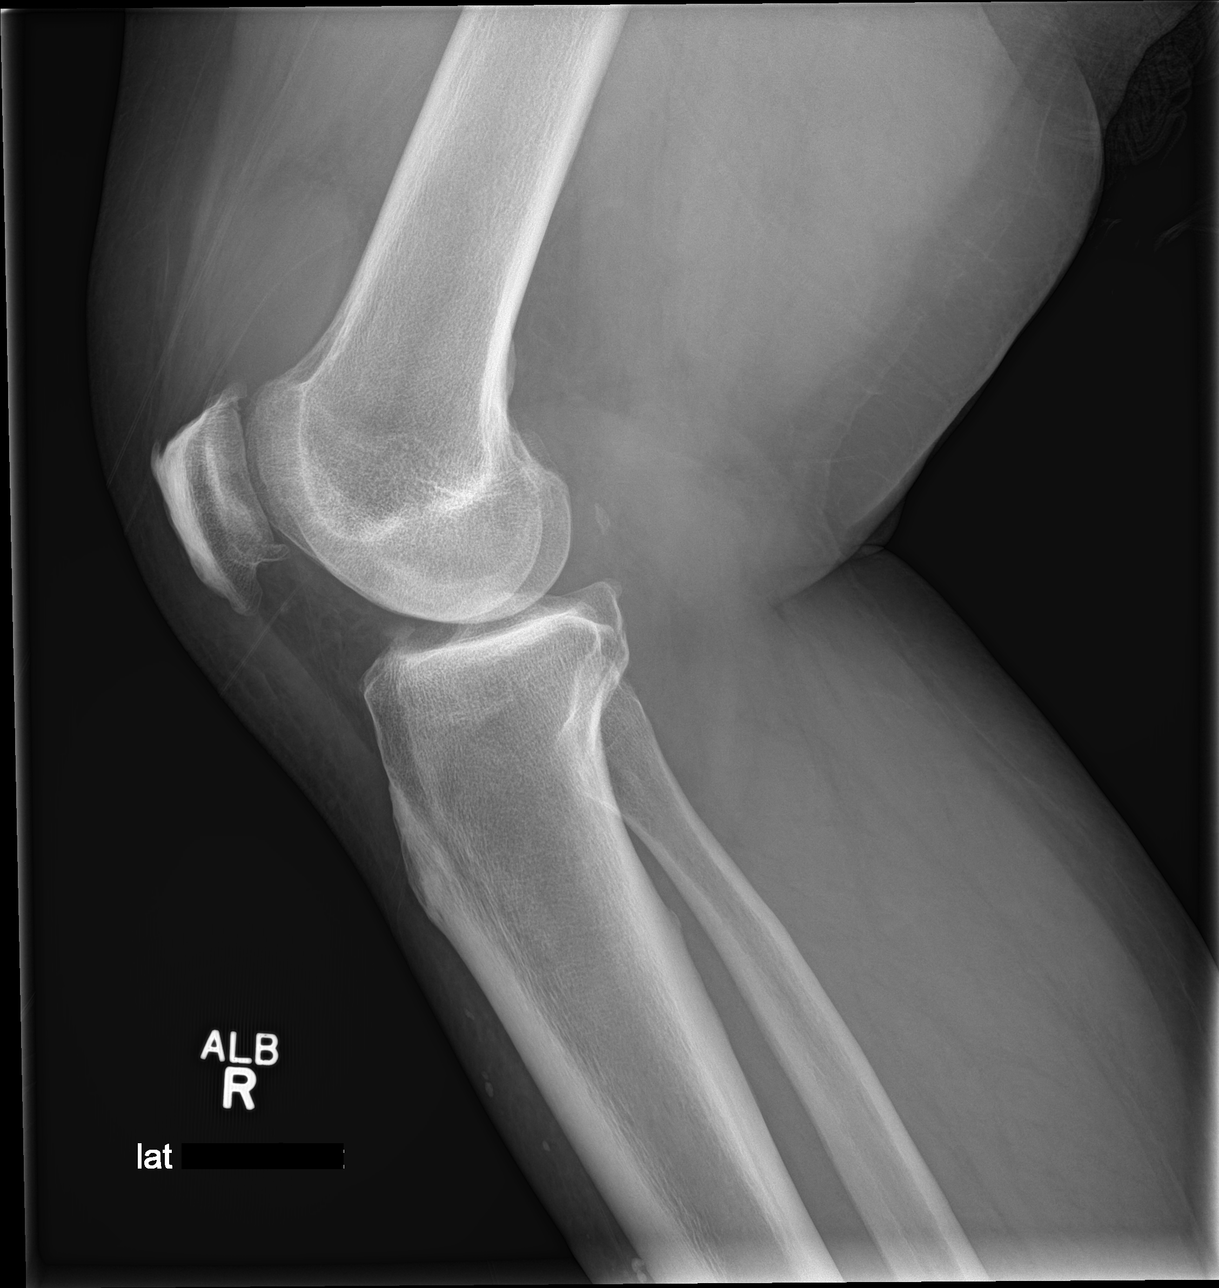

[tunnel]
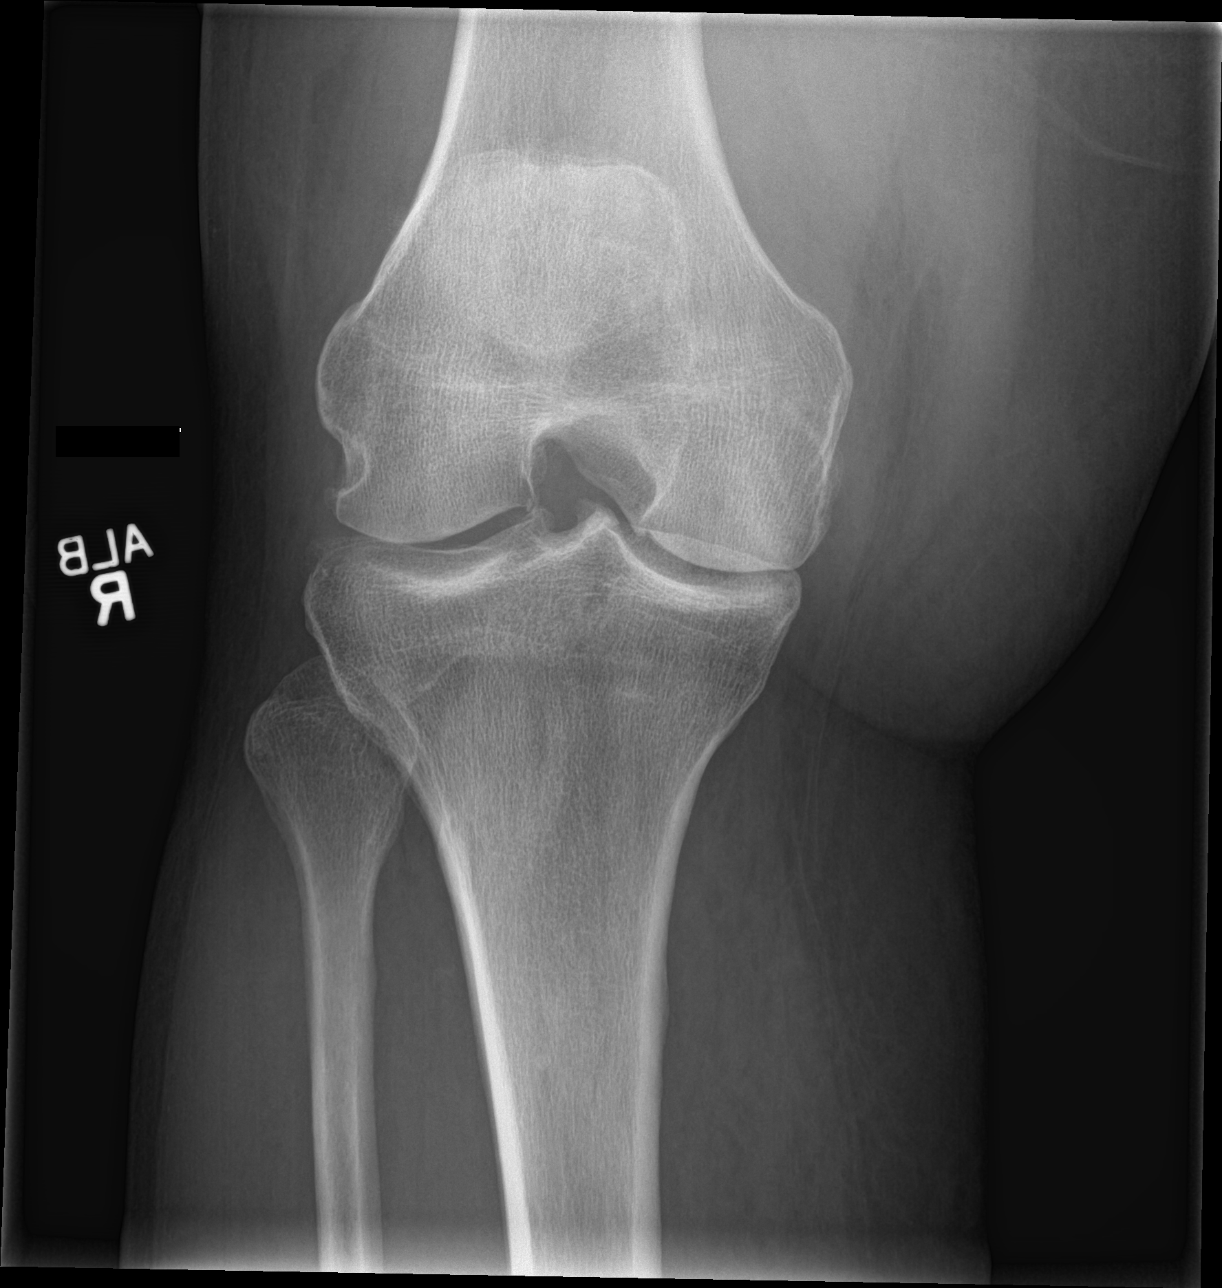

[patella skyline]
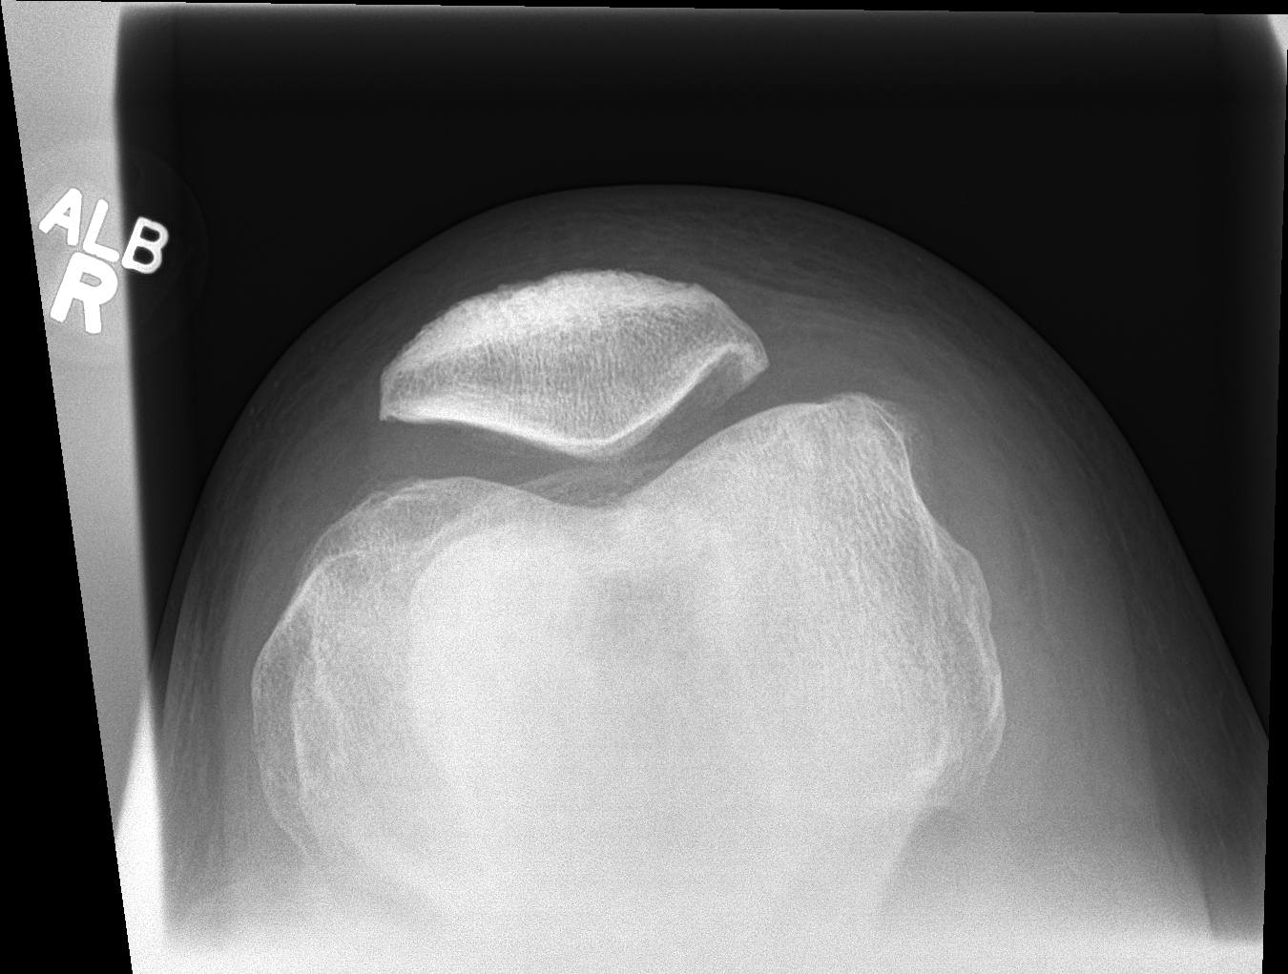

[4 of 4 positions shown; findings below may reference images not displayed]

FINDINGS: There is a large joint effusion. Medial compartment joint space
narrowing and subchondral sclerosis. Tricompartment osteophytosis.
No acute osseous abnormality.
IMPRESSION: 1. Large knee joint effusion.
2. Tricompartment osteoarthritis, worst in the medial compartment.

## 2022-06-23 ENCOUNTER — Ambulatory Visit
Admission: EM | Admit: 2022-06-23 | Discharge: 2022-06-23 | Disposition: A | Discharge status: Home / Self Care | Payer: BC Managed Care – PPO | Attending: Emergency Medicine | Admitting: Emergency Medicine

## 2022-06-23 DIAGNOSIS — R319 Hematuria, unspecified: Secondary | ICD-10-CM

## 2022-06-23 DIAGNOSIS — R300 Dysuria: Secondary | ICD-10-CM | POA: Diagnosis present

## 2022-06-23 DIAGNOSIS — R102 Pelvic and perineal pain: Secondary | ICD-10-CM

## 2022-06-23 DIAGNOSIS — R109 Unspecified abdominal pain: Secondary | ICD-10-CM | POA: Diagnosis present

## 2022-06-23 LAB — POCT URINALYSIS DIP (MANUAL ENTRY)
Bilirubin, UA: NEGATIVE
Glucose, UA: NEGATIVE mg/dL
Ketones, POC UA: NEGATIVE mg/dL
Nitrite, UA: NEGATIVE
Protein Ur, POC: 100 mg/dL — AB
Spec Grav, UA: 1.02 (ref 1.010–1.025)
Urobilinogen, UA: 0.2 E.U./dL
pH, UA: 5.5 (ref 5.0–8.0)

## 2022-06-23 MED ORDER — CEPHALEXIN 500 MG PO CAPS: 500.0000 mg | ORAL_CAPSULE | Freq: Two times a day (BID) | ORAL | 0 refills | Status: AC

## 2022-06-23 NOTE — ED Provider Notes (Signed)
Caleb Banks    CSN: [id number] Arrival date & time: 06/23/22  1910      History   Chief Complaint Chief Complaint  Patient presents with   Dysuria    HPI Caleb Pha. is a 62 y.o. male.  Patient presents with chills, hot flashes, hematuria, dysuria, urinary urgency, suprapubic pain, nausea, right flank pain x 2-3 days.  He contacted his PCP today and was instructed to come to urgent care for urine check.  He denies fever, chest pain, shortness of breath, or other symptoms.  No OTC medications taken today.  He has a uretal stent; He was hospitalized at [facility] on 06/11/2022 for ureterolithiasis.  His medical history includes ileostomy, kidney stones, pyelonephritis, diabetes, Crohn's colitis, pulmonary embolism.    The history is provided by the patient and medical records.    Past Medical History:  Diagnosis Date   Crohn's disease ([place])    Diabetes mellitus without complication ([place])    Pyelonephritis     Patient Active Problem List   Diagnosis Date Noted   Elevated LDL cholesterol level 02/04/2020   Ileostomy status ([place]) 05/26/2019   Arthralgia of right knee 04/10/2018   Stenosis of rectum and anus 05/10/2017   Crohn's colitis, other complication ([place]) 0000000   Pain of both hip joints 02/16/2017   Microcytic anemia 02/16/2017   Sciatica of left side 02/16/2017   Episodic lightheadedness 02/16/2017    Past Surgical History:  Procedure Laterality Date   COLONOSCOPY WITH PROPOFOL N/A 04/08/2017   Procedure: COLONOSCOPY WITH PROPOFOL;  Surgeon: Lollie Sails, MD;  Location: [facility] ENDOSCOPY;  Service: Endoscopy;  Laterality: N/A;   ESOPHAGOGASTRODUODENOSCOPY (EGD) WITH PROPOFOL N/A 04/08/2017   Procedure: ESOPHAGOGASTRODUODENOSCOPY (EGD) WITH PROPOFOL;  Surgeon: Lollie Sails, MD;  Location: [facility] ENDOSCOPY;  Service: Endoscopy;  Laterality: N/A;   EXPLORATORY LAPAROTOMY  05/11/2019   with ileostomy placement   URETERAL STENT PLACEMENT          Home Medications    Prior to Admission medications   Medication Sig Start Date End Date Taking? Authorizing Provider  cephALEXin (KEFLEX) 500 MG capsule Take 1 capsule (500 mg total) by mouth 2 (two) times daily for 5 days. 06/23/22 06/28/22 Yes Sharion Balloon, NP  atorvastatin (LIPITOR) 20 MG tablet Take 20 mg by mouth daily. 11/10/20 11/10/21  [provider]  JANUVIA 25 MG tablet Take 25 mg by mouth every morning. 12/09/20   [provider]  metFORMIN (GLUCOPHAGE) 500 MG tablet Take 500 mg by mouth 2 (two) times daily with a meal. Pt only taking 1 x day    [provider]  potassium citrate (UROCIT-K) 10 MEQ (1080 MG) SR tablet Take 10 mEq by mouth 2 (two) times daily. 11/03/20   [provider]  sildenafil (VIAGRA) 25 MG tablet Take 0.5 tablets (12.5 mg total) by mouth daily as needed for erectile dysfunction. Patient not taking: No sig reported 12/20/19   Glean Hess, MD  tamsulosin (FLOMAX) 0.4 MG CAPS capsule Take 0.4 mg by mouth daily. 10/28/20   [provider]  XARELTO 20 MG TABS tablet Take 20 mg by mouth daily. 10/22/20   [provider]    Family History Family History  Problem Relation Age of Onset   Pulmonary embolism Father    Gout Father     Social History Social History   Tobacco Use   Smoking status: Never   Smokeless tobacco: Never  Vaping Use   Vaping Use:  Never used  Substance Use Topics   Alcohol use: No   Drug use: No     Allergies   Sulfa antibiotics   Review of Systems Review of Systems  Constitutional:  Positive for chills. Negative for fever.  Respiratory:  Negative for cough and shortness of breath.   Cardiovascular:  Negative for chest pain and palpitations.  Gastrointestinal:  Positive for abdominal pain and nausea. Negative for vomiting.  Genitourinary:  Positive for dysuria, flank pain, frequency and hematuria. Negative for penile discharge and testicular pain.  Skin:  Negative  for rash.  All other systems reviewed and are negative.    Physical Exam Triage Vital Signs ED Triage Vitals  Enc Vitals Group     BP      Pulse      Resp      Temp      Temp src      SpO2      Weight      Height      Head Circumference      Peak Flow      Pain Score      Pain Loc      Pain Edu?      Excl. in [place]?    No data found.  Updated Vital Signs BP 133/79   Pulse (!) 103   Temp 98.9 F (37.2 C)   Resp 18   SpO2 95%   Visual Acuity Right Eye Distance:   Left Eye Distance:   Bilateral Distance:    Right Eye Near:   Left Eye Near:    Bilateral Near:     Physical Exam Vitals and nursing note reviewed.  Constitutional:      General: He is not in acute distress.    Appearance: Normal appearance. He is well-developed. He is not ill-appearing.  HENT:     Mouth/Throat:     Mouth: Mucous membranes are moist.  Cardiovascular:     Rate and Rhythm: Normal rate and regular rhythm.     Heart sounds: Normal heart sounds.  Pulmonary:     Effort: Pulmonary effort is normal. No respiratory distress.     Breath sounds: Normal breath sounds.  Abdominal:     General: Bowel sounds are normal.     Palpations: Abdomen is soft.     Tenderness: There is no abdominal tenderness. There is no right CVA tenderness, left CVA tenderness, guarding or rebound.  Musculoskeletal:     Cervical back: Neck supple.  Skin:    General: Skin is warm and dry.  Neurological:     Mental Status: He is alert.  Psychiatric:        Mood and Affect: Mood normal.        Behavior: Behavior normal.      UC Treatments / Results  Labs (all labs ordered are listed, but only abnormal results are displayed) Labs Reviewed  POCT URINALYSIS DIP (MANUAL ENTRY) - Abnormal; Notable for the following components:      Result Value   Color, UA other (*)    Clarity, UA cloudy (*)    Blood, UA large (*)    Protein Ur, POC =100 (*)    Leukocytes, UA Small (1+) (*)    All other components within  normal limits  URINE CULTURE    EKG   Radiology No results found.  Procedures Procedures (including critical care time)  Medications Ordered in UC Medications - No data to display  Initial Impression / Assessment  and Plan / UC Course  I have reviewed the triage vital signs and the nursing notes.  Pertinent labs & imaging results that were available during my care of the patient were reviewed by me and considered in my medical decision making (see chart for details).    Dysuria, Hematuria, Suprapubic pain, Flank pain.  Patient declines transfer to the ED.  He is afebrile at this time.  Abdomen is nontender and no CVAT.  Treating with Keflex. Urine culture pending. Instructed to follow up with his urologist tomorrow morning.  ED precautions discussed at length.  He agrees to plan of care.      Final Clinical Impressions(s) / UC Diagnoses   Final diagnoses:  Dysuria  Hematuria, unspecified type  Suprapubic pain  Flank pain     Discharge Instructions      Go to the emergency department if your symptoms continue or get worse.    Follow up with your urologist tomorrow morning.    Take the antibiotic as directed.  The urine culture is pending.         ED Prescriptions     Medication Sig Dispense Auth. Provider   cephALEXin (KEFLEX) 500 MG capsule Take 1 capsule (500 mg total) by mouth 2 (two) times daily for 5 days. 10 capsule Sharion Balloon, NP      PDMP not reviewed this encounter.   Sharion Balloon, NP 06/23/22 1949

## 2022-06-23 NOTE — Discharge Instructions (Addendum)
Go to the emergency department if your symptoms continue or get worse.    Follow up with your urologist tomorrow morning.    Take the antibiotic as directed.  The urine culture is pending.

## 2022-06-23 NOTE — ED Triage Notes (Signed)
Patient to Urgent Care with complaints of chills/ hot flashes. Has noticed some blood in his urine. Urinary urgency. Symptoms started 2-3 days ago. Endorses some suprapubic pain/ nausea, right sided flank aching.   Currently has a ureteral stent (present for 2 weeks).

## 2022-06-25 LAB — URINE CULTURE: Culture: NO GROWTH

## 2023-05-21 ENCOUNTER — Ambulatory Visit: Payer: BC Managed Care – PPO

## 2023-05-21 ENCOUNTER — Ambulatory Visit
Admission: EM | Admit: 2023-05-21 | Discharge: 2023-05-21 | Disposition: A | Discharge status: Home / Self Care | Payer: BC Managed Care – PPO | Attending: Family Medicine | Admitting: Family Medicine

## 2023-05-21 ENCOUNTER — Encounter: Payer: Self-pay | Admitting: Emergency Medicine

## 2023-05-21 DIAGNOSIS — S86019A Strain of unspecified Achilles tendon, initial encounter: Secondary | ICD-10-CM | POA: Diagnosis not present

## 2023-05-21 DIAGNOSIS — S93401A Sprain of unspecified ligament of right ankle, initial encounter: Secondary | ICD-10-CM

## 2023-05-21 HISTORY — DX: Other pulmonary embolism without acute cor pulmonale: I26.99

## 2023-05-21 NOTE — Discharge Instructions (Addendum)
Keep your ankle elevated is much as possible to help decrease swelling and aid in healing.  Apply moist heat to your ankle for 20 minutes at a time to help improve blood flow which will bring fresh oxygen and nutrients to the ligaments and help facilitate the removal of metabolic byproducts from inflammation.  Take over-the-counter Tylenol, 1000 mg every 6 hours, as needed for pain.  You may apply over-the-counter Voltaren gel, 2 g to your right ankle, every 6 hours as needed for pain and inflammation.  Wear the air cast ankle brace when up and moving.  You may take it off at nighttime, when bathing, and when not walking on your ankle.  Use the crutches to assist with ambulation.  You may weight-bear as tolerated and gradually increase weightbearing as your pain and inflammation improve.  Follow the rehabilitation exercises given your discharge instructions.  Wait to start the phase 1 exercises until 48 hours after your injury to give time for the inflammation to go down.  Progress to phase 2 after you can complete phase 1 with out any significant pain.   If your pain and inflammation do not improve the next 7 to 10 days I recommend that you follow-up with orthopedics.

## 2023-05-21 NOTE — ED Triage Notes (Signed)
Patient reports right ankle and right foot pain and swelling that started on Monday and has gotten worse.  Patient states that he did trip in the hallway on Sunday.  Patient denies falling.

## 2023-05-21 NOTE — ED Provider Notes (Addendum)
[facility]    CSN: [id number] Arrival date & time: 05/21/23  7829      History   Chief Complaint Chief Complaint  Patient presents with   Foot Pain   Ankle Pain    right    HPI Caleb Banks. is a 63 y.o. male.   HPI  63 year old male with past medical history significant for PE, diabetes, Crohn's disease, and pyelonephritis presents for evaluation of pain and swelling to his right foot and ankle.  He reports that he was out of town last weekend and his foot got caught in the handle of a tote bag which caused him to stumble.  He was able to catch himself and did not fall.  He did have pain initially but it was not bad.  The next day he went to wear a pair of boots and noticed that he had some irritation in his ankle.  2 days later he had an inability to bear weight without significant pain.  He does have some numbness in his toes but he states that that is not a new problem.  He denies any tingling or bruising.  Past Medical History:  Diagnosis Date   Crohn's disease ([facility])    Diabetes mellitus without complication ([facility])    Pulmonary embolism ([facility])    Pyelonephritis     Patient Active Problem List   Diagnosis Date Noted   Elevated LDL cholesterol level 02/04/2020   Ileostomy status ([facility]) 05/26/2019   Arthralgia of right knee 04/10/2018   Stenosis of rectum and anus 05/10/2017   Crohn's colitis, other complication ([facility]) 02/16/2017   Pain of both hip joints 02/16/2017   Microcytic anemia 02/16/2017   Sciatica of left side 02/16/2017   Episodic lightheadedness 02/16/2017    Past Surgical History:  Procedure Laterality Date   COLONOSCOPY WITH PROPOFOL N/A 04/08/2017   Procedure: COLONOSCOPY WITH PROPOFOL;  Surgeon: Christena Deem, MD;  Location: [facility] ENDOSCOPY;  Service: Endoscopy;  Laterality: N/A;   ESOPHAGOGASTRODUODENOSCOPY (EGD) WITH PROPOFOL N/A 04/08/2017   Procedure: ESOPHAGOGASTRODUODENOSCOPY (EGD) WITH PROPOFOL;  Surgeon: Christena Deem,  MD;  Location: [facility] ENDOSCOPY;  Service: Endoscopy;  Laterality: N/A;   EXPLORATORY LAPAROTOMY  05/11/2019   with ileostomy placement   URETERAL STENT PLACEMENT         Home Medications    Prior to Admission medications   Medication Sig Start Date End Date Taking? Authorizing Provider  atorvastatin (LIPITOR) 20 MG tablet Take 20 mg by mouth daily. 11/10/20 05/21/23 Yes [provider]  metFORMIN (GLUCOPHAGE) 500 MG tablet Take 500 mg by mouth 2 (two) times daily with a meal. Pt only taking 1 x day   Yes [provider]  potassium citrate (UROCIT-K) 10 MEQ (1080 MG) SR tablet Take 10 mEq by mouth 2 (two) times daily. 11/03/20  Yes [provider]  XARELTO 20 MG TABS tablet Take 20 mg by mouth daily. 10/22/20  Yes [provider]  JANUVIA 25 MG tablet Take 25 mg by mouth every morning. 12/09/20   [provider]  sildenafil (VIAGRA) 25 MG tablet Take 0.5 tablets (12.5 mg total) by mouth daily as needed for erectile dysfunction. Patient not taking: No sig reported 12/20/19   Reubin Milan, MD  tamsulosin (FLOMAX) 0.4 MG CAPS capsule Take 0.4 mg by mouth daily. 10/28/20   [provider]    Family History Family History  Problem Relation Age of Onset   Pulmonary embolism Father  Gout Father     Social History Social History   Tobacco Use   Smoking status: Never   Smokeless tobacco: Never  Vaping Use   Vaping status: Never Used  Substance Use Topics   Alcohol use: No   Drug use: No     Allergies   Sulfa antibiotics   Review of Systems Review of Systems  Musculoskeletal:  Positive for arthralgias and joint swelling.  Skin:  Negative for color change.  Neurological:  Positive for numbness.     Physical Exam Triage Vital Signs ED Triage Vitals  Encounter Vitals Group     BP 05/21/23 0913 133/70     Systolic BP Percentile --      Diastolic BP Percentile --      Pulse Rate 05/21/23 0913 92     Resp 05/21/23 0913  16     Temp 05/21/23 0913 98.4 F (36.9 C)     Temp Source 05/21/23 0913 Oral     SpO2 05/21/23 0913 99 %     Weight 05/21/23 0910 (!) 342 lb 9.5 oz (155.4 kg)     Height 05/21/23 0910 6\' 1"  (1.854 m)     Head Circumference --      Peak Flow --      Pain Score 05/21/23 0910 8     Pain Loc --      Pain Education --      Exclude from Growth Chart --    No data found.  Updated Vital Signs BP 133/70 (BP Location: Left Arm)   Pulse 92   Temp 98.4 F (36.9 C) (Oral)   Resp 16   Ht 6\' 1"  (1.854 m)   Wt (!) 342 lb 9.5 oz (155.4 kg)   SpO2 99%   BMI 45.20 kg/m   Visual Acuity Right Eye Distance:   Left Eye Distance:   Bilateral Distance:    Right Eye Near:   Left Eye Near:    Bilateral Near:     Physical Exam Vitals and nursing note reviewed.  Constitutional:      Appearance: Normal appearance. He is not ill-appearing.  HENT:     Head: Normocephalic and atraumatic.  Musculoskeletal:        General: Swelling, tenderness and signs of injury present. No deformity. Normal range of motion.  Skin:    General: Skin is warm and dry.     Capillary Refill: Capillary refill takes less than 2 seconds.     Findings: No bruising or erythema.  Neurological:     General: No focal deficit present.     Mental Status: He is alert and oriented to person, place, and time.     Sensory: No sensory deficit.     Motor: No weakness.      UC Treatments / Results  Labs (all labs ordered are listed, but only abnormal results are displayed) Labs Reviewed - No data to display  EKG   Radiology No results found.  Procedures Procedures (including critical care time)  Medications Ordered in UC Medications - No data to display  Initial Impression / Assessment and Plan / UC Course  I have reviewed the triage vital signs and the nursing notes.  Pertinent labs & imaging results that were available during my care of the patient were reviewed by me and considered in my medical decision  making (see chart for details).   Patient is a pleasant, nontoxic-appearing 63 year old male presenting for evaluation of pain and swelling to his right  foot and ankle as outlined HPI above.  Physical exam reveals a foot ankle that is in normal anatomical alignment and patient has full active range of motion of his ankle though it does cause some discomfort.  He is reporting pain circumferentially around the ankle joint though there is no pain with palpation of the medial or lateral malleolus, midfoot, or with compression of the base of the fifth metatarsal.  He has mild tenderness with palpation of the Achilles though no concern for rupture given his intact range of motion.  I will obtain radiographs of his right foot and ankle to evaluate for any bony abnormality.  Right ankle films independently reviewed and evaluated by me.  Impression: No evidence of fracture or dislocation.  Ankle mortise joint is well-maintained.  There is a radiopaque lucency present on the lateral view inferior to the tibia and anterior to the tibiotalar joint.  Soft tissue swelling is present.  Radiology overread is pending.  Right foot x-rays independently reviewed and evaluated by me.  Impression: No evidence of fracture or dislocation.  Mild soft tissue swelling is present.  Radiology overread is pending.  I will discharge patient home with a diagnosis of right ankle sprain and strain of his Achilles tendon and have staff fit him with an Aircast for support.  I will also have them provide him with crutches that he can use to assist with ambulation.  Ice, elevation, over-the-counter Tylenol and NSAIDs as needed for pain and inflammation.  Home physical therapy exercises will also be prescribed.  If symptoms do not improve in a week to 10 days, or they worsen, I will have him follow-up with orthopedics.   Final Clinical Impressions(s) / UC Diagnoses   Final diagnoses:  Sprain of right ankle, unspecified ligament, initial  encounter  Strain of Achilles tendon, initial encounter     Discharge Instructions      Keep your ankle elevated is much as possible to help decrease swelling and aid in healing.  Apply moist heat to your ankle for 20 minutes at a time to help improve blood flow which will bring fresh oxygen and nutrients to the ligaments and help facilitate the removal of metabolic byproducts from inflammation.  Take over-the-counter Tylenol, 1000 mg every 6 hours, as needed for pain.  You may apply over-the-counter Voltaren gel, 2 g to your right ankle, every 6 hours as needed for pain and inflammation.  Wear the air cast ankle brace when up and moving.  You may take it off at nighttime, when bathing, and when not walking on your ankle.  Use the crutches to assist with ambulation.  You may weight-bear as tolerated and gradually increase weightbearing as your pain and inflammation improve.  Follow the rehabilitation exercises given your discharge instructions.  Wait to start the phase 1 exercises until 48 hours after your injury to give time for the inflammation to go down.  Progress to phase 2 after you can complete phase 1 with out any significant pain.   If your pain and inflammation do not improve the next 7 to 10 days I recommend that you follow-up with orthopedics.     ED Prescriptions   None    PDMP not reviewed this encounter.   Becky Augusta, NP 05/21/23 8119    Becky Augusta, NP 05/21/23 1007
